# Patient Record
Sex: Male | Born: 1985 | Race: White | Hispanic: No | Marital: Married | Smoking: Former smoker
Health system: Southern US, Community
[De-identification: ages and names within clinical notes are randomized; demographics above are authoritative.]

## PROBLEM LIST (undated history)

## (undated) DIAGNOSIS — I509 Heart failure, unspecified: Secondary | ICD-10-CM

## (undated) DIAGNOSIS — I2510 Atherosclerotic heart disease of native coronary artery without angina pectoris: Secondary | ICD-10-CM

## (undated) DIAGNOSIS — I10 Essential (primary) hypertension: Secondary | ICD-10-CM

## (undated) DIAGNOSIS — F17200 Nicotine dependence, unspecified, uncomplicated: Secondary | ICD-10-CM

## (undated) HISTORY — DX: Atherosclerotic heart disease of native coronary artery without angina pectoris: I25.10

## (undated) HISTORY — DX: Heart failure, unspecified: I50.9

---

## 2004-11-08 ENCOUNTER — Emergency Department: Payer: Self-pay | Admitting: Emergency Medicine

## 2006-10-15 ENCOUNTER — Inpatient Hospital Stay (HOSPITAL_COMMUNITY): Admission: EM | Admit: 2006-10-15 | Discharge: 2006-10-21 | Payer: Self-pay | Admitting: Emergency Medicine

## 2007-01-25 ENCOUNTER — Ambulatory Visit (HOSPITAL_COMMUNITY): Admission: RE | Admit: 2007-01-25 | Discharge: 2007-01-25 | Payer: Self-pay | Admitting: Orthopedic Surgery

## 2007-02-09 ENCOUNTER — Emergency Department: Payer: Self-pay | Admitting: Emergency Medicine

## 2008-01-11 ENCOUNTER — Emergency Department: Payer: Self-pay | Admitting: Emergency Medicine
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
## 2009-01-19 ENCOUNTER — Emergency Department: Payer: Self-pay | Admitting: Emergency Medicine

## 2009-02-18 ENCOUNTER — Emergency Department: Payer: Self-pay | Admitting: Emergency Medicine

## 2009-03-19 ENCOUNTER — Emergency Department: Payer: Self-pay | Admitting: Internal Medicine

## 2009-07-13 ENCOUNTER — Emergency Department: Payer: Self-pay | Admitting: Emergency Medicine
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
## 2010-11-12 LAB — BASIC METABOLIC PANEL
BUN: 9
Calcium: 9.7
Creatinine, Ser: 0.93
GFR calc non Af Amer: >60

## 2010-11-12 LAB — CBC
MCV: 86.8
Platelets: 204
WBC: 9.6

## 2010-11-19 LAB — I-STAT 8, (EC8 V) (CONVERTED LAB)
Acid-base deficit: 6 — ABNORMAL HIGH
Bicarbonate: 21.5
Glucose, Bld: 122 — ABNORMAL HIGH
Hemoglobin: 15.3
Potassium: 3.5
Sodium: 138
TCO2: 23

## 2010-11-19 LAB — CBC
HCT: 35 — ABNORMAL LOW
HCT: 35.9 — ABNORMAL LOW
Hemoglobin: 13.9
MCHC: 34.8
MCV: 89.4
MCV: 90.3
Platelets: 142 — ABNORMAL LOW
Platelets: 150
Platelets: 205
RBC: 4.02 — ABNORMAL LOW
RBC: 4.35
RDW: 13.7
RDW: 14.1 — ABNORMAL HIGH
WBC: 12.8 — ABNORMAL HIGH
WBC: 7.1
WBC: 9.9

## 2010-11-19 LAB — PROTIME-INR: INR: 1.1

## 2010-11-19 LAB — BASIC METABOLIC PANEL
BUN: 11
Calcium: 9.8
Creatinine, Ser: 0.96
GFR calc Af Amer: >60
GFR calc non Af Amer: >60

## 2010-11-19 LAB — ETHANOL: Alcohol, Ethyl (B): 131 — ABNORMAL HIGH

## 2014-01-19 ENCOUNTER — Emergency Department: Payer: Self-pay | Admitting: Emergency Medicine

## 2015-09-18 DIAGNOSIS — N398 Other specified disorders of urinary system: Secondary | ICD-10-CM | POA: Insufficient documentation

## 2015-09-18 DIAGNOSIS — Z87442 Personal history of urinary calculi: Secondary | ICD-10-CM | POA: Insufficient documentation

## 2020-10-23 ENCOUNTER — Inpatient Hospital Stay: Payer: Medicaid Other

## 2020-10-23 ENCOUNTER — Encounter: Payer: Self-pay | Admitting: Cardiology

## 2020-10-23 ENCOUNTER — Inpatient Hospital Stay
Admission: EM | Admit: 2020-10-23 | Discharge: 2020-10-27 | DRG: 246 | Disposition: A | Discharge status: Home / Self Care | Payer: Medicaid Other | Attending: Internal Medicine | Admitting: Internal Medicine

## 2020-10-23 ENCOUNTER — Encounter
Admission: EM | Disposition: A | Discharge status: Home / Self Care | Payer: Self-pay | Source: Home / Self Care | Attending: Internal Medicine

## 2020-10-23 ENCOUNTER — Emergency Department: Payer: Medicaid Other

## 2020-10-23 DIAGNOSIS — R52 Pain, unspecified: Secondary | ICD-10-CM

## 2020-10-23 DIAGNOSIS — I998 Other disorder of circulatory system: Secondary | ICD-10-CM | POA: Diagnosis not present

## 2020-10-23 DIAGNOSIS — Z20822 Contact with and (suspected) exposure to covid-19: Secondary | ICD-10-CM | POA: Diagnosis present

## 2020-10-23 DIAGNOSIS — J690 Pneumonitis due to inhalation of food and vomit: Secondary | ICD-10-CM | POA: Diagnosis not present

## 2020-10-23 DIAGNOSIS — I2119 ST elevation (STEMI) myocardial infarction involving other coronary artery of inferior wall: Principal | ICD-10-CM | POA: Diagnosis present

## 2020-10-23 DIAGNOSIS — M79606 Pain in leg, unspecified: Secondary | ICD-10-CM | POA: Diagnosis not present

## 2020-10-23 DIAGNOSIS — Z0189 Encounter for other specified special examinations: Secondary | ICD-10-CM

## 2020-10-23 DIAGNOSIS — Z8241 Family history of sudden cardiac death: Secondary | ICD-10-CM

## 2020-10-23 DIAGNOSIS — Z79899 Other long term (current) drug therapy: Secondary | ICD-10-CM | POA: Diagnosis not present

## 2020-10-23 DIAGNOSIS — I2510 Atherosclerotic heart disease of native coronary artery without angina pectoris: Secondary | ICD-10-CM | POA: Diagnosis present

## 2020-10-23 DIAGNOSIS — J9692 Respiratory failure, unspecified with hypercapnia: Secondary | ICD-10-CM | POA: Diagnosis present

## 2020-10-23 DIAGNOSIS — Z4659 Encounter for fitting and adjustment of other gastrointestinal appliance and device: Secondary | ICD-10-CM

## 2020-10-23 DIAGNOSIS — R079 Chest pain, unspecified: Secondary | ICD-10-CM | POA: Diagnosis present

## 2020-10-23 DIAGNOSIS — F17210 Nicotine dependence, cigarettes, uncomplicated: Secondary | ICD-10-CM | POA: Diagnosis present

## 2020-10-23 DIAGNOSIS — R200 Anesthesia of skin: Secondary | ICD-10-CM

## 2020-10-23 DIAGNOSIS — I469 Cardiac arrest, cause unspecified: Secondary | ICD-10-CM

## 2020-10-23 DIAGNOSIS — I255 Ischemic cardiomyopathy: Secondary | ICD-10-CM | POA: Diagnosis present

## 2020-10-23 DIAGNOSIS — Z9911 Dependence on respirator [ventilator] status: Secondary | ICD-10-CM

## 2020-10-23 DIAGNOSIS — R7989 Other specified abnormal findings of blood chemistry: Secondary | ICD-10-CM

## 2020-10-23 DIAGNOSIS — D649 Anemia, unspecified: Secondary | ICD-10-CM | POA: Diagnosis present

## 2020-10-23 DIAGNOSIS — J9601 Acute respiratory failure with hypoxia: Secondary | ICD-10-CM | POA: Diagnosis present

## 2020-10-23 DIAGNOSIS — I4901 Ventricular fibrillation: Secondary | ICD-10-CM | POA: Diagnosis present

## 2020-10-23 DIAGNOSIS — J9602 Acute respiratory failure with hypercapnia: Secondary | ICD-10-CM | POA: Diagnosis present

## 2020-10-23 DIAGNOSIS — I472 Ventricular tachycardia: Secondary | ICD-10-CM | POA: Diagnosis present

## 2020-10-23 DIAGNOSIS — R7401 Elevation of levels of liver transaminase levels: Secondary | ICD-10-CM

## 2020-10-23 DIAGNOSIS — Z978 Presence of other specified devices: Secondary | ICD-10-CM

## 2020-10-23 DIAGNOSIS — I213 ST elevation (STEMI) myocardial infarction of unspecified site: Secondary | ICD-10-CM | POA: Diagnosis not present

## 2020-10-23 DIAGNOSIS — M79676 Pain in unspecified toe(s): Secondary | ICD-10-CM

## 2020-10-23 DIAGNOSIS — R579 Shock, unspecified: Secondary | ICD-10-CM | POA: Diagnosis present

## 2020-10-23 DIAGNOSIS — I442 Atrioventricular block, complete: Secondary | ICD-10-CM | POA: Diagnosis present

## 2020-10-23 HISTORY — PX: CORONARY/GRAFT ACUTE MI REVASCULARIZATION: CATH118305

## 2020-10-23 HISTORY — DX: Nicotine dependence, unspecified, uncomplicated: F17.200

## 2020-10-23 HISTORY — PX: LEFT HEART CATH AND CORONARY ANGIOGRAPHY: CATH118249

## 2020-10-23 HISTORY — DX: Essential (primary) hypertension: I10

## 2020-10-23 LAB — GLUCOSE, CAPILLARY
Glucose-Capillary: 215 mg/dL — ABNORMAL HIGH (ref 70–99)
Glucose-Capillary: 145 mg/dL — ABNORMAL HIGH (ref 70–99)

## 2020-10-23 LAB — URINE DRUG SCREEN, QUALITATIVE (ARMC ONLY)
Amphetamines, Ur Screen: NOT DETECTED
Barbiturates, Ur Screen: NOT DETECTED
Benzodiazepine, Ur Scrn: NOT DETECTED
Cannabinoid 50 Ng, Ur ~~LOC~~: POSITIVE — AB
Cocaine Metabolite,Ur ~~LOC~~: NOT DETECTED
MDMA (Ecstasy)Ur Screen: NOT DETECTED
Methadone Scn, Ur: NOT DETECTED
Opiate, Ur Screen: NOT DETECTED
Phencyclidine (PCP) Ur S: NOT DETECTED
Tricyclic, Ur Screen: NOT DETECTED

## 2020-10-23 LAB — URINALYSIS, ROUTINE W REFLEX MICROSCOPIC
Bilirubin Urine: NEGATIVE
Glucose, UA: NEGATIVE mg/dL
Ketones, ur: NEGATIVE mg/dL
Leukocytes,Ua: NEGATIVE
Nitrite: NEGATIVE
Protein, ur: NEGATIVE mg/dL
Specific Gravity, Urine: 1.02 (ref 1.005–1.030)
Squamous Epithelial / HPF: NONE SEEN (ref 0–5)
WBC, UA: NONE SEEN WBC/hpf (ref 0–5)
pH: 5 (ref 5.0–8.0)

## 2020-10-23 LAB — RESP PANEL BY RT-PCR (FLU A&B, COVID) ARPGX2
Influenza A by PCR: NEGATIVE
Influenza B by PCR: NEGATIVE
SARS Coronavirus 2 by RT PCR: NEGATIVE

## 2020-10-23 LAB — COMPREHENSIVE METABOLIC PANEL
ALT: 111 U/L — ABNORMAL HIGH (ref 0–44)
AST: 75 U/L — ABNORMAL HIGH (ref 15–41)
Albumin: 2 g/dL — ABNORMAL LOW (ref 3.5–5.0)
Alkaline Phosphatase: 24 U/L — ABNORMAL LOW (ref 38–126)
Anion gap: 11 (ref 5–15)
BUN: 9 mg/dL (ref 6–20)
CO2: 16 mmol/L — ABNORMAL LOW (ref 22–32)
Calcium: 5.1 mg/dL — CL (ref 8.9–10.3)
Chloride: 121 mmol/L — ABNORMAL HIGH (ref 98–111)
Creatinine, Ser: 0.65 mg/dL (ref 0.61–1.24)
GFR, Estimated: >60 mL/min (ref >60)
Glucose, Bld: 126 mg/dL — ABNORMAL HIGH (ref 70–99)
Potassium: <2 mmol/L — CL (ref 3.5–5.1)
Sodium: 148 mmol/L — ABNORMAL HIGH (ref 135–145)
Total Bilirubin: 0.4 mg/dL (ref 0.3–1.2)
Total Protein: 3.5 g/dL — ABNORMAL LOW (ref 6.5–8.1)

## 2020-10-23 LAB — TROPONIN I (HIGH SENSITIVITY): Troponin I (High Sensitivity): 8 ng/L (ref <18)

## 2020-10-23 LAB — CBC
HCT: 27.6 % — ABNORMAL LOW (ref 39.0–52.0)
Hemoglobin: 9.4 g/dL — ABNORMAL LOW (ref 13.0–17.0)
MCH: 32.4 pg (ref 26.0–34.0)
MCHC: 34.1 g/dL (ref 30.0–36.0)
MCV: 95.2 fL (ref 80.0–100.0)
Platelets: 78 10*3/uL — ABNORMAL LOW (ref 150–400)
RBC: 2.9 MIL/uL — ABNORMAL LOW (ref 4.22–5.81)
RDW: 13.2 % (ref 11.5–15.5)
WBC: 6.3 10*3/uL (ref 4.0–10.5)
nRBC: 0.3 % — ABNORMAL HIGH (ref 0.0–0.2)

## 2020-10-23 LAB — POTASSIUM: Potassium: 3.5 mmol/L (ref 3.5–5.1)

## 2020-10-23 LAB — MAGNESIUM: Magnesium: 2.4 mg/dL (ref 1.7–2.4)

## 2020-10-23 LAB — BLOOD GAS, ARTERIAL
Acid-base deficit: 7.6 mmol/L — ABNORMAL HIGH (ref 0.0–2.0)
Bicarbonate: 19.3 mmol/L — ABNORMAL LOW (ref 20.0–28.0)
FIO2: 60
MECHVT: 550 mL
O2 Saturation: 98.8 %
PEEP: 5 cmH2O
Patient temperature: 37
RATE: 16 resp/min
pCO2 arterial: 43 mmHg (ref 32.0–48.0)
pH, Arterial: 7.26 — ABNORMAL LOW (ref 7.350–7.450)
pO2, Arterial: 140 mmHg — ABNORMAL HIGH (ref 83.0–108.0)

## 2020-10-23 LAB — PROTIME-INR
INR: 1.5 — ABNORMAL HIGH (ref 0.8–1.2)
Prothrombin Time: 18.5 seconds — ABNORMAL HIGH (ref 11.4–15.2)

## 2020-10-23 LAB — PHOSPHORUS: Phosphorus: 4.4 mg/dL (ref 2.5–4.6)

## 2020-10-23 LAB — BRAIN NATRIURETIC PEPTIDE: B Natriuretic Peptide: 9.4 pg/mL (ref 0.0–100.0)

## 2020-10-23 LAB — MRSA NEXT GEN BY PCR, NASAL: MRSA by PCR Next Gen: NOT DETECTED

## 2020-10-23 LAB — POCT ACTIVATED CLOTTING TIME: Activated Clotting Time: 404 seconds

## 2020-10-23 SURGERY — CORONARY/GRAFT ACUTE MI REVASCULARIZATION
Anesthesia: Moderate Sedation

## 2020-10-23 MED ORDER — POTASSIUM CHLORIDE 10 MEQ/100ML IV SOLN
10.0000 meq | INTRAVENOUS | Status: DC
Start: 2020-10-23 — End: 2020-10-24
  Administered 2020-10-23: 10 meq via INTRAVENOUS
  Filled 2020-10-23 (×4): qty 100

## 2020-10-23 MED ORDER — NOREPINEPHRINE 4 MG/250ML-% IV SOLN
2.0000 ug/min | INTRAVENOUS | Status: DC
  Administered 2020-10-23: 5 ug/min via INTRAVENOUS
  Filled 2020-10-23: qty 250

## 2020-10-23 MED ORDER — LABETALOL HCL 5 MG/ML IV SOLN: 10.0000 mg | INTRAVENOUS | Status: DC | PRN

## 2020-10-23 MED ORDER — DOCUSATE SODIUM 50 MG/5ML PO LIQD: 100.0000 mg | Freq: Two times a day (BID) | ORAL | Status: DC | PRN

## 2020-10-23 MED ORDER — SODIUM CHLORIDE 0.9% FLUSH
3.0000 mL | Freq: Two times a day (BID) | INTRAVENOUS | Status: DC
  Administered 2020-10-24 – 2020-10-27 (×7): 3 mL via INTRAVENOUS

## 2020-10-23 MED ORDER — ACETAMINOPHEN 325 MG PO TABS: 650.0000 mg | ORAL_TABLET | ORAL | Status: DC | PRN

## 2020-10-23 MED ORDER — PROPOFOL 1000 MG/100ML IV EMUL
0.0000 ug/kg/min | INTRAVENOUS | Status: DC
  Administered 2020-10-23: 40 ug/kg/min via INTRAVENOUS
  Administered 2020-10-23 – 2020-10-24 (×3): 50 ug/kg/min via INTRAVENOUS
  Filled 2020-10-23 (×4): qty 100

## 2020-10-23 MED ORDER — ASPIRIN 81 MG PO CHEW
CHEWABLE_TABLET | ORAL | Status: DC | PRN
  Administered 2020-10-23: 324 mg via NASOGASTRIC

## 2020-10-23 MED ORDER — MIDAZOLAM HCL 2 MG/2ML IJ SOLN: 2.0000 mg | INTRAMUSCULAR | Status: DC | PRN

## 2020-10-23 MED ORDER — IOHEXOL 350 MG/ML SOLN
INTRAVENOUS | Status: DC | PRN
  Administered 2020-10-23: 150 mL

## 2020-10-23 MED ORDER — INSULIN ASPART 100 UNIT/ML IJ SOLN
0.0000 [IU] | INTRAMUSCULAR | Status: DC
  Administered 2020-10-23: 2 [IU] via SUBCUTANEOUS
  Administered 2020-10-23: 5 [IU] via SUBCUTANEOUS
  Filled 2020-10-23 (×2): qty 1

## 2020-10-23 MED ORDER — ATORVASTATIN CALCIUM 20 MG PO TABS
80.0000 mg | ORAL_TABLET | Freq: Every day | ORAL | Status: DC
  Filled 2020-10-23: qty 4

## 2020-10-23 MED ORDER — CHLORHEXIDINE GLUCONATE CLOTH 2 % EX PADS
6.0000 | MEDICATED_PAD | Freq: Every day | CUTANEOUS | Status: DC
  Administered 2020-10-23 – 2020-10-24 (×2): 6 via TOPICAL

## 2020-10-23 MED ORDER — POLYETHYLENE GLYCOL 3350 17 G PO PACK: 17.0000 g | PACK | Freq: Every day | ORAL | Status: DC

## 2020-10-23 MED ORDER — CANGRELOR TETRASODIUM 50 MG IV SOLR
INTRAVENOUS | Status: AC
  Filled 2020-10-23: qty 50

## 2020-10-23 MED ORDER — ASPIRIN 81 MG PO CHEW
CHEWABLE_TABLET | ORAL | Status: AC
  Filled 2020-10-23: qty 4

## 2020-10-23 MED ORDER — BIVALIRUDIN BOLUS VIA INFUSION - CUPID
INTRAVENOUS | Status: DC | PRN
  Administered 2020-10-23: 73.425 mg via INTRAVENOUS

## 2020-10-23 MED ORDER — SODIUM CHLORIDE 0.9 % WEIGHT BASED INFUSION
1.0000 mL/kg/h | INTRAVENOUS | Status: DC
  Administered 2020-10-23 – 2020-10-24 (×2): 1 mL/kg/h via INTRAVENOUS

## 2020-10-23 MED ORDER — NOREPINEPHRINE BITARTRATE 1 MG/ML IV SOLN
INTRAVENOUS | Status: AC | PRN
  Administered 2020-10-23: 10 ug/min via INTRAVENOUS

## 2020-10-23 MED ORDER — AMIODARONE HCL IN DEXTROSE 360-4.14 MG/200ML-% IV SOLN
INTRAVENOUS | Status: AC
  Administered 2020-10-23: 13 mg/h
  Filled 2020-10-23: qty 200

## 2020-10-23 MED ORDER — ENOXAPARIN SODIUM 40 MG/0.4ML IJ SOSY: 40.0000 mg | PREFILLED_SYRINGE | INTRAMUSCULAR | Status: DC

## 2020-10-23 MED ORDER — TICAGRELOR 90 MG PO TABS
ORAL_TABLET | ORAL | Status: AC
  Filled 2020-10-23: qty 2

## 2020-10-23 MED ORDER — LIDOCAINE HCL 1 % IJ SOLN
INTRAMUSCULAR | Status: AC
  Filled 2020-10-23: qty 20

## 2020-10-23 MED ORDER — ORAL CARE MOUTH RINSE
15.0000 mL | OROMUCOSAL | Status: DC
  Administered 2020-10-23 – 2020-10-24 (×5): 15 mL via OROMUCOSAL

## 2020-10-23 MED ORDER — NOREPINEPHRINE BITARTRATE 1 MG/ML IV SOLN
INTRAVENOUS | Status: AC | PRN
  Administered 2020-10-23: 10 ug/kg/min via INTRAVENOUS

## 2020-10-23 MED ORDER — CHLORHEXIDINE GLUCONATE 0.12% ORAL RINSE (MEDLINE KIT)
15.0000 mL | Freq: Two times a day (BID) | OROMUCOSAL | Status: DC
  Administered 2020-10-23 – 2020-10-25 (×3): 15 mL via OROMUCOSAL

## 2020-10-23 MED ORDER — ASPIRIN 81 MG PO CHEW
81.0000 mg | CHEWABLE_TABLET | Freq: Every day | ORAL | Status: DC
  Administered 2020-10-24 – 2020-10-27 (×4): 81 mg via ORAL
  Filled 2020-10-23 (×4): qty 1

## 2020-10-23 MED ORDER — DOCUSATE SODIUM 50 MG/5ML PO LIQD
100.0000 mg | Freq: Two times a day (BID) | ORAL | Status: DC
  Administered 2020-10-24: 100 mg
  Filled 2020-10-23: qty 10

## 2020-10-23 MED ORDER — PANTOPRAZOLE SODIUM 40 MG IV SOLR
40.0000 mg | INTRAVENOUS | Status: DC
  Administered 2020-10-23 – 2020-10-25 (×3): 40 mg via INTRAVENOUS
  Filled 2020-10-23 (×3): qty 40

## 2020-10-23 MED ORDER — BIVALIRUDIN TRIFLUOROACETATE 250 MG IV SOLR
INTRAVENOUS | Status: AC
  Filled 2020-10-23: qty 250

## 2020-10-23 MED ORDER — SODIUM CHLORIDE 0.9 % IV SOLN: 250.0000 mL | INTRAVENOUS | Status: DC | PRN

## 2020-10-23 MED ORDER — CALCIUM GLUCONATE-NACL 2-0.675 GM/100ML-% IV SOLN
2.0000 g | Freq: Once | INTRAVENOUS | Status: AC
  Administered 2020-10-23: 2000 mg via INTRAVENOUS
  Filled 2020-10-23: qty 100

## 2020-10-23 MED ORDER — MIDAZOLAM HCL 2 MG/2ML IJ SOLN
2.0000 mg | INTRAMUSCULAR | Status: DC | PRN
  Administered 2020-10-23 – 2020-10-24 (×2): 2 mg via INTRAVENOUS
  Filled 2020-10-23 (×2): qty 2

## 2020-10-23 MED ORDER — CANGRELOR BOLUS VIA INFUSION
INTRAVENOUS | Status: DC | PRN
  Administered 2020-10-23: 2937 ug via INTRAVENOUS

## 2020-10-23 MED ORDER — SODIUM CHLORIDE 0.9% FLUSH
3.0000 mL | INTRAVENOUS | Status: DC | PRN
  Administered 2020-10-26 – 2020-10-27 (×2): 3 mL via INTRAVENOUS

## 2020-10-23 MED ORDER — NOREPINEPHRINE BITARTRATE 1 MG/ML IV SOLN
INTRAVENOUS | Status: AC
  Filled 2020-10-23: qty 4

## 2020-10-23 MED ORDER — SODIUM CHLORIDE 0.9 % IV SOLN
250.0000 mL | INTRAVENOUS | Status: DC
  Administered 2020-10-23 – 2020-10-25 (×2): 250 mL via INTRAVENOUS

## 2020-10-23 MED ORDER — SODIUM CHLORIDE 0.9 % IV SOLN
INTRAVENOUS | Status: DC | PRN
  Administered 2020-10-23: 1.75 mg/kg/h via INTRAVENOUS

## 2020-10-23 MED ORDER — HYDRALAZINE HCL 20 MG/ML IJ SOLN: 10.0000 mg | INTRAMUSCULAR | Status: DC | PRN

## 2020-10-23 MED ORDER — LIDOCAINE HCL (PF) 1 % IJ SOLN
INTRAMUSCULAR | Status: DC | PRN
  Administered 2020-10-23: 30 mL

## 2020-10-23 MED ORDER — TICAGRELOR 90 MG PO TABS
90.0000 mg | ORAL_TABLET | Freq: Two times a day (BID) | ORAL | Status: DC
  Administered 2020-10-24 – 2020-10-27 (×7): 90 mg via ORAL
  Filled 2020-10-23 (×7): qty 1

## 2020-10-23 MED ORDER — POTASSIUM CHLORIDE 20 MEQ PO PACK
40.0000 meq | PACK | Freq: Once | ORAL | Status: AC
  Administered 2020-10-23: 40 meq
  Filled 2020-10-23: qty 2

## 2020-10-23 MED ORDER — SODIUM CHLORIDE 0.9 % IV SOLN
3.0000 g | Freq: Four times a day (QID) | INTRAVENOUS | Status: DC
  Administered 2020-10-23 – 2020-10-26 (×11): 3 g via INTRAVENOUS
  Filled 2020-10-23: qty 8
  Filled 2020-10-23 (×5): qty 3
  Filled 2020-10-23: qty 8
  Filled 2020-10-23 (×2): qty 3
  Filled 2020-10-23 (×3): qty 8
  Filled 2020-10-23: qty 3
  Filled 2020-10-23: qty 8

## 2020-10-23 MED ORDER — ONDANSETRON HCL 4 MG/2ML IJ SOLN
4.0000 mg | Freq: Four times a day (QID) | INTRAMUSCULAR | Status: DC | PRN
  Administered 2020-10-24: 4 mg via INTRAVENOUS
  Filled 2020-10-23: qty 2

## 2020-10-23 MED ORDER — FENTANYL CITRATE (PF) 100 MCG/2ML IJ SOLN: 50.0000 ug | INTRAMUSCULAR | Status: DC | PRN

## 2020-10-23 MED ORDER — TICAGRELOR 90 MG PO TABS
ORAL_TABLET | ORAL | Status: DC | PRN
  Administered 2020-10-23: 180 mg via NASOGASTRIC

## 2020-10-23 MED ORDER — AMIODARONE HCL 150 MG/3ML IV SOLN
INTRAVENOUS | Status: DC | PRN
  Administered 2020-10-23: 30 mg via INTRAVENOUS

## 2020-10-23 MED ORDER — SODIUM CHLORIDE 0.9 % IV SOLN
INTRAVENOUS | Status: AC | PRN
  Administered 2020-10-23: 4 ug/kg/min via INTRAVENOUS

## 2020-10-23 MED ORDER — ENOXAPARIN SODIUM 60 MG/0.6ML IJ SOSY
0.5000 mg/kg | PREFILLED_SYRINGE | INTRAMUSCULAR | Status: DC
  Administered 2020-10-23: 50 mg via SUBCUTANEOUS
  Filled 2020-10-23: qty 0.5

## 2020-10-23 MED ORDER — FENTANYL CITRATE (PF) 100 MCG/2ML IJ SOLN
50.0000 ug | INTRAMUSCULAR | Status: DC | PRN
  Administered 2020-10-23: 100 ug via INTRAVENOUS
  Filled 2020-10-23: qty 2

## 2020-10-23 MED ORDER — AMIODARONE HCL IN DEXTROSE 360-4.14 MG/200ML-% IV SOLN
30.0000 mg/h | INTRAVENOUS | Status: DC
  Administered 2020-10-23 – 2020-10-25 (×4): 30 mg/h via INTRAVENOUS
  Filled 2020-10-23 (×3): qty 200

## 2020-10-23 SURGICAL SUPPLY — 19 items
BALLN TREK RX 2.25X15 (BALLOONS) ×2
BALLOON TREK RX 2.25X15 (BALLOONS) ×1 IMPLANT
CATH INFINITI 5FR ANG PIGTAIL (CATHETERS) ×2 IMPLANT
CATH INFINITI 5FR JL4 (CATHETERS) ×2 IMPLANT
CATH INFINITI JR4 5F (CATHETERS) ×2 IMPLANT
CATH VISTA GUIDE 6FR JR4 (CATHETERS) ×2 IMPLANT
DEVICE CLOSURE MYNXGRIP 6/7F (Vascular Products) ×2 IMPLANT
DRAPE BRACHIAL (DRAPES) ×2 IMPLANT
KIT ENCORE 26 ADVANTAGE (KITS) ×2 IMPLANT
KIT SYRINGE INJ CVI SPIKEX1 (MISCELLANEOUS) ×2 IMPLANT
NEEDLE PERC 18GX7CM (NEEDLE) ×2 IMPLANT
PACK CARDIAC CATH (CUSTOM PROCEDURE TRAY) ×2 IMPLANT
PROTECTION STATION PRESSURIZED (MISCELLANEOUS) ×2
SET ATX SIMPLICITY (MISCELLANEOUS) ×2 IMPLANT
SHEATH AVANTI 6FR X 11CM (SHEATH) ×2 IMPLANT
STATION PROTECTION PRESSURIZED (MISCELLANEOUS) ×1 IMPLANT
STENT ONYX FRONTIER 3.0X18 (Permanent Stent) ×2 IMPLANT
WIRE ASAHI PROWATER 180CM (WIRE) ×2 IMPLANT
WIRE GUIDERIGHT .035X150 (WIRE) ×2 IMPLANT

## 2020-10-23 NOTE — Progress Notes (Signed)
Anticoagulation monitoring(Lovenox):  35 yo  male ordered Lovenox 40 mg Q24h    Filed Weights   10/23/20 1759  Weight: 97.9 kg (215 lb 13.3 oz)   BMI 33   Lab Results  Component Value Date   CREATININE 0.65 10/23/2020   CREATININE 0.93 01/24/2007   CREATININE 0.96 10/19/2006   Estimated Creatinine Clearance: 143.6 mL/min (by C-G formula based on SCr of 0.65 mg/dL). Hemoglobin & Hematocrit     Component Value Date/Time   HGB 9.4 (L) 10/23/2020 1733   HCT 27.6 (L) 10/23/2020 1733     Per Protocol for Patient with estCrcl > 30 ml/min and BMI > 30, will transition to Lovenox 50 mg Q24h.
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
## 2020-10-23 NOTE — ED Notes (Signed)
Pt lying in bed intubated with 8.0 tube 22 at lip. Pt lungs equal and expanding with oxygen saturation in high 90s. Pt family at bedside with cardiologist speaking about heart cath. Pt vitals stable at this time.
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
## 2020-10-23 NOTE — Code Documentation (Addendum)
1701 Pt arrived to room 26, no pulse palpated and compressions started @ 1700 when pulled from car 1701- 1 epinephrine 1702 -- shock Asystole 1703 CPR resumed  1705 Lucas placed 1705 1 epinephrine 1708 Pulse check-v fib-shock-compressions resumed 1709 Amiodorone 150 push 1710 Ketamine 80 1710 Rocuronium 80 1711 1 epinephrine 1712 pulse check-no pulse, fine v-fib, shock, asystole, resume compressions 1713, nor epi started at 25 mcg/min 1715 pulse check, v fib, defib, resume compressions 1716 1 amp bicarb 1716 1 epinephrine 1717 TNK 40 mg 1718 pulse check-? [place]? -synched cardioversion 1721 pulse check-ROSC 1723 2 gram magnesium 1725 150 amiodorone 1728 paused norepi d/t increased BP 1735 norepinephrine started back at 10 mcg/min d/t hypotension

## 2020-10-23 NOTE — Progress Notes (Signed)
Chaplain Maggie offered follow up support to family in ICU waiting room. Gratitude was expressed as the events of the day were considered reflectively. Continued support available per on call chaplain.

## 2020-10-23 NOTE — Consult Note (Signed)
[facility] Cardiology  CARDIOLOGY CONSULT NOTE  Patient ID: [name] MRN: [id number] DOB/AGE: 36-Mar-1987 36 y.o.  Admit date: 10/23/2020 Referring Physician [facility] Primary Physician  Primary Cardiologist  Reason for Consultation inferior STEMI  HPI: 35 year old gentleman referred for inferior STEMI.  Earlier today, the patient was brought by family members for evaluation of chest pain and shortness of breath.  As they pulled into [facility] ED, patient became unresponsive.  In the emergency room, the patient was pulseless with ventricular tachycardia, received CPR and multiple shocks.  The patient was intubated, placed on Levophed and amiodarone drip.  ECG revealed ST elevations in inferior and lateral leads with ST depression in leads I and aVL.  The patient was brought urgently to the cardiac catheterization laboratory where coronary angiography revealed occluded proximal RCA.  The patient underwent primary PCI receiving DES in proximal RCA with excellent angiographic result.  Left ventriculography revealed preserved left ventricular function, with LV ejection fraction 50 to 55% with mild inferior wall hypokinesis.  According to family members, the patient has no prior medical history, with exception of tobacco abuse.  Review of systems complete and found to be negative unless listed above      No medications prior to admission.   Social History   Socioeconomic History   Marital status: Single    Spouse name: Not on file   Number of children: Not on file   Years of education: Not on file   Highest education level: Not on file  Occupational History   Not on file  Tobacco Use   Smoking status: Not on file   Smokeless tobacco: Not on file  Substance and Sexual Activity   Alcohol use: Not on file   Drug use: Not on file   Sexual activity: Not on file  Other Topics Concern   Not on file  Social History Narrative   Not on file   Social Determinants of Health   Financial Resource  Strain: Not on file  Food Insecurity: Not on file  Transportation Needs: Not on file  Physical Activity: Not on file  Stress: Not on file  Social Connections: Not on file  Intimate Partner Violence: Not on file    No family history on file.    Review of systems complete and found to be negative unless listed above      PHYSICAL EXAM  General: Intubated HEENT:  Normocephalic and atramatic Neck:  No JVD.  Lungs: Clear bilaterally to auscultation and percussion. Heart: HRRR . Normal S1 and S2 without gallops or murmurs.  Abdomen: Bowel sounds are positive, abdomen soft and non-tender  Msk:  Back normal, normal gait. Normal strength and tone for age. Extremities: No clubbing, cyanosis or edema.   Neuro: Intubated Psych: Intubated  Labs:   Lab Results  Component Value Date   WBC 6.3 10/23/2020   HGB 9.4 (L) 10/23/2020   HCT 27.6 (L) 10/23/2020   MCV 95.2 10/23/2020   PLT 78 (L) 10/23/2020    Recent Labs  Lab 10/23/20 1734  NA 148*  K <2.0*  CL 121*  CO2 16*  BUN 9  CREATININE 0.65  CALCIUM 5.1*  PROT 3.5*  BILITOT 0.4  ALKPHOS 24*  ALT 111*  AST 75*  GLUCOSE 126*   No results found for: CKTOTAL, CKMB, CKMBINDEX, TROPONINI No results found for: CHOL No results found for: HDL No results found for: LDLCALC No results found for: TRIG No results found for: CHOLHDL No results found for: LDLDIRECT  Radiology: CARDIAC CATHETERIZATION  Result Date: 10/23/2020   Prox RCA-1 lesion is 100% stenosed.   Mid Cx to Dist Cx lesion is 30% stenosed.   Prox RCA-2 lesion is 50% stenosed.   RPDA lesion is 100% stenosed.   A drug-eluting stent was successfully placed using a STENT ONYX FRONTIER 3.0X18.   Post intervention, there is a 0% residual stenosis.   LV end diastolic pressure is normal.   The left ventricular ejection fraction is 50-55% by visual estimate. 1.  Inferior ST elevation myocardial infarction 2.  Successful primary PCI with DES ostial/proximal RCA 3.   Preserved left ventricular function, with estimated LV ejection fraction 30% Recommendations 1.  Dual antiplatelet therapy uninterrupted for 1 year 2.  High intensity atorvastatin 3.  2D echocardiogram   DG Chest Port 1 View  Result Date: 10/23/2020 CLINICAL DATA:  CPR; post CPR after found unresponsive EXAM: PORTABLE CHEST 1 VIEW COMPARISON:  2015 FINDINGS: Endotracheal tube in satisfactory position. Enteric tube tip is at the gastroesophageal junction. Lung volumes are low. No consolidation. Probable patchy atelectasis. No pleural effusion. No pneumothorax. IMPRESSION: Endotracheal tube in place. Enteric tube tip at gastroesophageal junction. Low lung volumes with probable patchy atelectasis. Electronically Signed   By: Guadlupe Spanish M.D.   On: 10/23/2020 18:36    EKG: Sinus rhythm with ST elevation in leads II, III and aVF V4 through V6  ASSESSMENT AND PLAN:   1.  Inferior ST elevation myocardial infarction, with occluded proximal RCA, status post successful primary PCI with DES proximal RCA, with preserved left ventricular function 2.  Status post cardiac arrest, intubated, on amiodarone drip  Recommendations  1.  Dual antiplatelet therapy uninterrupted for 1 year 2.  High intensity atorvastatin 80 mg daily 3.  2D echocardiogram 4.  Further recommendations including beta-blocker and ACE inhibitor when patient extubated and stabilized  Signed: Marcina Millard MD,PhD, [facility] 10/23/2020, 8:06 PM

## 2020-10-23 NOTE — Progress Notes (Signed)
Pharmacy Antibiotic Note  [name] is a 35 y.o. male admitted on 10/23/2020 with  STEMI .  Pharmacy has been consulted for Unasyn dosing for aspiration pneumonia  Plan: Unasyn 3g IV q6h  Height: 5\' 7"  (170.2 cm) Weight: 97.9 kg (215 lb 13.3 oz) IBW/kg (Calculated) : 66.1  Temp (24hrs), Avg:97.3 F (36.3 C), Min:95.5 F (35.3 C), Max:98.2 F (36.8 C)  Recent Labs  Lab 10/23/20 1733 10/23/20 1734  WBC 6.3  --   CREATININE  --  0.65    Estimated Creatinine Clearance: 143.6 mL/min (by C-G formula based on SCr of 0.65 mg/dL).    No Known Allergies  Antimicrobials this admission: Unasyn 9/16 >>     Dose adjustments this admission:  Microbiology results:  Thank you for allowing pharmacy to be a part of this patient's care.  10/16, PharmD, BCPS 10/23/2020 9:41 PM

## 2020-10-23 NOTE — Progress Notes (Signed)
Chaplain Maggie responded to ED request to accompany pt's wife while cpr was in process. Space was made for being present to the family until pt was stabilized and eventually moved to cath lab. Hospitality, prayer and compassionate presence was offered. Chaplain hosted family and guided them to the cath lab waiting area. Continued support available per on call chaplain.

## 2020-10-23 NOTE — Progress Notes (Signed)
eLink Physician-Brief Progress Note Patient Name: [name] DOB: 1985/11/10 MRN: [id number]   Date of Service  10/23/2020  HPI/Events of Note  STEMI; cardiac arrest; s/p cath/pci; remains on vent  eICU Interventions  Abg pending; pt stable in ICU     Intervention Category Evaluation Type: New Patient Evaluation  Jacinta Shoe 10/23/2020, 9:50 PM

## 2020-10-23 NOTE — ED Triage Notes (Signed)
Pt presents via POV for unresponsive episode. Pt arrived unresponsive when ED nurses pulled pt from vehicle, pt had no pulse. CPR initiated and pt brought back to room 26.

## 2020-10-24 ENCOUNTER — Inpatient Hospital Stay: Payer: Medicaid Other

## 2020-10-24 DIAGNOSIS — I469 Cardiac arrest, cause unspecified: Secondary | ICD-10-CM

## 2020-10-24 DIAGNOSIS — J9601 Acute respiratory failure with hypoxia: Secondary | ICD-10-CM

## 2020-10-24 DIAGNOSIS — R579 Shock, unspecified: Secondary | ICD-10-CM

## 2020-10-24 LAB — COMPREHENSIVE METABOLIC PANEL
ALT: 337 U/L — ABNORMAL HIGH (ref 0–44)
ALT: 345 U/L — ABNORMAL HIGH (ref 0–44)
AST: 395 U/L — ABNORMAL HIGH (ref 15–41)
AST: 346 U/L — ABNORMAL HIGH (ref 15–41)
Albumin: 3.5 g/dL (ref 3.5–5.0)
Albumin: 3.7 g/dL (ref 3.5–5.0)
Alkaline Phosphatase: 45 U/L (ref 38–126)
Alkaline Phosphatase: 45 U/L (ref 38–126)
Anion gap: 7 (ref 5–15)
Anion gap: 9 (ref 5–15)
BUN: 19 mg/dL (ref 6–20)
BUN: 18 mg/dL (ref 6–20)
CO2: 21 mmol/L — ABNORMAL LOW (ref 22–32)
CO2: 21 mmol/L — ABNORMAL LOW (ref 22–32)
Calcium: 8.2 mg/dL — ABNORMAL LOW (ref 8.9–10.3)
Calcium: 8 mg/dL — ABNORMAL LOW (ref 8.9–10.3)
Chloride: 109 mmol/L (ref 98–111)
Chloride: 107 mmol/L (ref 98–111)
Creatinine, Ser: 1.07 mg/dL (ref 0.61–1.24)
Creatinine, Ser: 1.06 mg/dL (ref 0.61–1.24)
GFR, Estimated: >60 mL/min (ref >60)
GFR, Estimated: >60 mL/min (ref >60)
Glucose, Bld: 105 mg/dL — ABNORMAL HIGH (ref 70–99)
Glucose, Bld: 104 mg/dL — ABNORMAL HIGH (ref 70–99)
Potassium: 5.4 mmol/L — ABNORMAL HIGH (ref 3.5–5.1)
Potassium: 4.9 mmol/L (ref 3.5–5.1)
Sodium: 137 mmol/L (ref 135–145)
Sodium: 137 mmol/L (ref 135–145)
Total Bilirubin: 0.7 mg/dL (ref 0.3–1.2)
Total Bilirubin: 0.6 mg/dL (ref 0.3–1.2)
Total Protein: 6.3 g/dL — ABNORMAL LOW (ref 6.5–8.1)
Total Protein: 6.3 g/dL — ABNORMAL LOW (ref 6.5–8.1)

## 2020-10-24 LAB — CBC
HCT: 41.7 % (ref 39.0–52.0)
Hemoglobin: 15.1 g/dL (ref 13.0–17.0)
MCH: 32.3 pg (ref 26.0–34.0)
MCHC: 36.2 g/dL — ABNORMAL HIGH (ref 30.0–36.0)
MCV: 89.3 fL (ref 80.0–100.0)
Platelets: 187 10*3/uL (ref 150–400)
RBC: 4.67 MIL/uL (ref 4.22–5.81)
RDW: 13.5 % (ref 11.5–15.5)
WBC: 18.6 10*3/uL — ABNORMAL HIGH (ref 4.0–10.5)
nRBC: 0 % (ref 0.0–0.2)

## 2020-10-24 LAB — TROPONIN I (HIGH SENSITIVITY): Troponin I (High Sensitivity): >24000 ng/L (ref <18)

## 2020-10-24 LAB — LIPID PANEL
Cholesterol: 176 mg/dL (ref 0–200)
HDL: 27 mg/dL — ABNORMAL LOW (ref >40)
LDL Cholesterol: 118 mg/dL — ABNORMAL HIGH (ref 0–99)
Total CHOL/HDL Ratio: 6.5 RATIO
Triglycerides: 156 mg/dL — ABNORMAL HIGH (ref <150)
VLDL: 31 mg/dL (ref 0–40)

## 2020-10-24 LAB — BLOOD GAS, ARTERIAL
Acid-base deficit: 2.9 mmol/L — ABNORMAL HIGH (ref 0.0–2.0)
Bicarbonate: 22 mmol/L (ref 20.0–28.0)
FIO2: 50
MECHVT: 520 mL
O2 Saturation: 99.5 %
PEEP: 5 cmH2O
Patient temperature: 37
RATE: 24 resp/min
pCO2 arterial: 38 mmHg (ref 32.0–48.0)
pH, Arterial: 7.37 (ref 7.350–7.450)
pO2, Arterial: 170 mmHg — ABNORMAL HIGH (ref 83.0–108.0)

## 2020-10-24 LAB — TRIGLYCERIDES: Triglycerides: 148 mg/dL (ref <150)

## 2020-10-24 LAB — GLUCOSE, CAPILLARY
Glucose-Capillary: 105 mg/dL — ABNORMAL HIGH (ref 70–99)
Glucose-Capillary: 98 mg/dL (ref 70–99)
Glucose-Capillary: 119 mg/dL — ABNORMAL HIGH (ref 70–99)
Glucose-Capillary: 110 mg/dL — ABNORMAL HIGH (ref 70–99)

## 2020-10-24 LAB — HEMOGLOBIN A1C
Hgb A1c MFr Bld: 5.4 % (ref 4.8–5.6)
Hgb A1c MFr Bld: 5 % (ref 4.8–5.6)
Mean Plasma Glucose: 108.28 mg/dL
Mean Plasma Glucose: 96.8 mg/dL

## 2020-10-24 LAB — TYPE AND SCREEN
ABO/RH(D): A NEG
Antibody Screen: NEGATIVE

## 2020-10-24 LAB — PHOSPHORUS: Phosphorus: 2.2 mg/dL — ABNORMAL LOW (ref 2.5–4.6)

## 2020-10-24 LAB — PROCALCITONIN: Procalcitonin: 1.94 ng/mL

## 2020-10-24 LAB — MAGNESIUM: Magnesium: 2.2 mg/dL (ref 1.7–2.4)

## 2020-10-24 LAB — HIV ANTIBODY (ROUTINE TESTING W REFLEX): HIV Screen 4th Generation wRfx: NONREACTIVE

## 2020-10-24 MED ORDER — METOPROLOL TARTRATE 25 MG PO TABS
25.0000 mg | ORAL_TABLET | Freq: Two times a day (BID) | ORAL | Status: DC
  Administered 2020-10-24 – 2020-10-27 (×6): 25 mg via ORAL
  Filled 2020-10-24 (×6): qty 1

## 2020-10-24 MED ORDER — LISINOPRIL 5 MG PO TABS
2.5000 mg | ORAL_TABLET | Freq: Every day | ORAL | Status: DC
  Administered 2020-10-25 – 2020-10-27 (×3): 2.5 mg via ORAL
  Filled 2020-10-24 (×3): qty 1

## 2020-10-24 MED ORDER — POTASSIUM & SODIUM PHOSPHATES 280-160-250 MG PO PACK: 2.0000 | PACK | Freq: Three times a day (TID) | ORAL | Status: DC

## 2020-10-24 MED ORDER — IBUPROFEN 400 MG PO TABS
800.0000 mg | ORAL_TABLET | Freq: Three times a day (TID) | ORAL | Status: DC | PRN
  Administered 2020-10-24: 800 mg
  Filled 2020-10-24: qty 2

## 2020-10-24 MED ORDER — IBUPROFEN 400 MG PO TABS
800.0000 mg | ORAL_TABLET | Freq: Three times a day (TID) | ORAL | Status: DC | PRN
  Administered 2020-10-25 – 2020-10-26 (×2): 800 mg via ORAL
  Filled 2020-10-24 (×2): qty 2

## 2020-10-24 MED ORDER — METOPROLOL TARTRATE 25 MG PO TABS
25.0000 mg | ORAL_TABLET | Freq: Two times a day (BID) | ORAL | Status: DC
  Administered 2020-10-24: 25 mg
  Filled 2020-10-24: qty 1

## 2020-10-24 MED ORDER — CHLORHEXIDINE GLUCONATE CLOTH 2 % EX PADS
6.0000 | MEDICATED_PAD | Freq: Every day | CUTANEOUS | Status: DC
  Administered 2020-10-25: 6 via TOPICAL

## 2020-10-24 MED ORDER — LISINOPRIL 5 MG PO TABS
2.5000 mg | ORAL_TABLET | Freq: Every day | ORAL | Status: DC
  Administered 2020-10-24: 2.5 mg
  Filled 2020-10-24: qty 1

## 2020-10-24 MED ORDER — CHLORHEXIDINE GLUCONATE 0.12 % MT SOLN
15.0000 mL | Freq: Two times a day (BID) | OROMUCOSAL | Status: DC
  Administered 2020-10-24 – 2020-10-25 (×2): 15 mL via OROMUCOSAL
  Filled 2020-10-24 (×2): qty 15

## 2020-10-24 MED ORDER — POLYVINYL ALCOHOL 1.4 % OP SOLN
1.0000 [drp] | OPHTHALMIC | Status: DC | PRN
  Administered 2020-10-24 – 2020-10-25 (×2): 1 [drp] via OPHTHALMIC
  Filled 2020-10-24: qty 15

## 2020-10-24 MED ORDER — POTASSIUM & SODIUM PHOSPHATES 280-160-250 MG PO PACK
2.0000 | PACK | Freq: Three times a day (TID) | ORAL | Status: AC
  Administered 2020-10-24 (×2): 2
  Filled 2020-10-24 (×2): qty 2

## 2020-10-24 MED ORDER — ENOXAPARIN SODIUM 40 MG/0.4ML IJ SOSY
40.0000 mg | PREFILLED_SYRINGE | INTRAMUSCULAR | Status: DC
  Administered 2020-10-24 – 2020-10-26 (×3): 40 mg via SUBCUTANEOUS
  Filled 2020-10-24 (×3): qty 0.4

## 2020-10-24 MED ORDER — DOCUSATE SODIUM 50 MG/5ML PO LIQD
100.0000 mg | Freq: Two times a day (BID) | ORAL | Status: DC | PRN
  Administered 2020-10-26: 100 mg via ORAL
  Filled 2020-10-24 (×2): qty 10

## 2020-10-24 MED ORDER — MENTHOL 3 MG MT LOZG
1.0000 | LOZENGE | OROMUCOSAL | Status: DC | PRN
  Filled 2020-10-24: qty 9

## 2020-10-24 MED ORDER — ORAL CARE MOUTH RINSE
15.0000 mL | Freq: Two times a day (BID) | OROMUCOSAL | Status: DC
  Administered 2020-10-26 – 2020-10-27 (×3): 15 mL via OROMUCOSAL

## 2020-10-24 NOTE — Consult Note (Signed)
PHARMACY CONSULT NOTE - FOLLOW UP  Pharmacy Consult for Electrolyte Monitoring and Replacement   Recent Labs: Potassium (mmol/L)  Date Value  10/24/2020 4.9   Magnesium (mg/dL)  Date Value  90/24/0973 2.2   Calcium (mg/dL)  Date Value  53/29/9242 8.0 (L)   Albumin (g/dL)  Date Value  68/34/1962 3.7   Phosphorus (mg/dL)  Date Value  22/97/9892 2.2 (L)   Sodium (mmol/L)  Date Value  10/24/2020 137     Assessment: 35 year old male presenting to [facility] ED on 10/23/2020 unresponsive in personal vehicle. S/p PCI with DES promximal RCA. On amio gtt.   Goal of Therapy:  WNL  Plan:  Will give [place] packets x 2.  F/u with AM labs.   Ronnald Ramp ,PharmD Clinical Pharmacist 10/24/2020 8:51 AM

## 2020-10-24 NOTE — Progress Notes (Signed)
Chaplain Maggie made follow up visit with pt and family at bedside. Ministry of presence and calm offered. Family doing well and eager to support pt as he wakes up. Continued support available per on call chaplain.

## 2020-10-24 NOTE — Progress Notes (Signed)
   10/24/20 1820  Clinical Encounter Type  Visited With Patient and family together  Visit Type Follow-up  Referral From Other (Comment) (rounding)  [place] (Comment) (celebration; fatigue)  Chaplain Burris followed-up with Pt and family. Chaplain met wife, [name] and checked in with Pt and mom, [name]. Spoke more with mother about how events can help [place] focus on what is most important and bring family together. Continued celebration for [name]'s survival. Family and Pt doing well tonight.

## 2020-10-24 NOTE — Progress Notes (Signed)
Pt complained of decreased knee sensation after extubation. This has now spread to ankle. Pt did have hematoma marked at groin site. Groin site is bruised but soft. Pt appears and moves symmetrically in all extremities. Pedal and radial pulses palpable. Symmetrical smile. MD made aware and new orders received.

## 2020-10-24 NOTE — Progress Notes (Signed)
10/24/20 1110  Clinical Encounter Type  Visited With Patient and family together  Visit Type Initial  Referral From Chaplain  Consult/Referral To [place] followed-up on referral from [place]. Met with Pt and mother at bedside. Chaplain Burris provided reflective listening and compassionate presence. Pt was talkative and reports feeling like he could "get right out of bed and walk around" and was checking on kids' birthday party! Mother and Pt reflective today and processing meaning and emotions post-event. Mother extremely appreciative of support and interventions from [place]. Chaplain Burris encouraged continued calm, centering, and slower pace with lots of self-care. Offered prayer at Pt's request.

## 2020-10-24 NOTE — Progress Notes (Signed)
[facility] Cardiology  SUBJECTIVE: Laying in bed surrounded by family members, denies chest pain   Vitals:   10/24/20 0700 10/24/20 0724 10/24/20 0800 10/24/20 0851  BP: 110/76  112/71   Pulse: (!) 101  (!) 105   Resp: 20  (!) 30   Temp: 99.7 F (37.6 C)  99.7 F (37.6 C)   TempSrc:   Bladder   SpO2: 97% 96% 96% 95%  Weight:      Height:         Intake/Output Summary (Last 24 hours) at 10/24/2020 1002 Last data filed at 10/24/2020 0736 Gross per 24 hour  Intake 1526.77 ml  Output 1225 ml  Net 301.77 ml      PHYSICAL EXAM  General: Well developed, well nourished, in no acute distress HEENT:  Normocephalic and atramatic Neck:  No JVD.  Lungs: Clear bilaterally to auscultation and percussion. Heart: HRRR . Normal S1 and S2 without gallops or murmurs.  Abdomen: Bowel sounds are positive, abdomen soft and non-tender  Msk:  Back normal, normal gait. Normal strength and tone for age. Extremities: No clubbing, cyanosis or edema.   Neuro: Alert and oriented X 3. Psych:  Good affect, responds appropriately   LABS: Basic Metabolic Panel: Recent Labs    10/23/20 2158 10/24/20 0450 10/24/20 0730  NA  --  137 137  K 3.5 5.4* 4.9  CL  --  109 107  CO2  --  21* 21*  GLUCOSE  --  105* 104*  BUN  --  19 18  CREATININE  --  1.07 1.06  CALCIUM  --  8.2* 8.0*  MG 2.4 2.2  --   PHOS 4.4 2.2*  --    Liver Function Tests: Recent Labs    10/24/20 0450 10/24/20 0730  AST 395* 346*  ALT 337* 345*  ALKPHOS 45 45  BILITOT 0.7 0.6  PROT 6.3* 6.3*  ALBUMIN 3.5 3.7   No results for input(s): LIPASE, AMYLASE in the last 72 hours. CBC: Recent Labs    10/23/20 1733 10/24/20 0450  WBC 6.3 18.6*  HGB 9.4* 15.1  HCT 27.6* 41.7  MCV 95.2 89.3  PLT 78* 187   Cardiac Enzymes: No results for input(s): CKTOTAL, CKMB, CKMBINDEX, TROPONINI in the last 72 hours. BNP: Invalid input(s): POCBNP D-Dimer: No results for input(s): DDIMER in the last 72 hours. Hemoglobin A1C: Recent  Labs    10/23/20 2158  HGBA1C 5.4   Fasting Lipid Panel: Recent Labs    10/24/20 0450  CHOL 176  HDL 27*  LDLCALC 118*  TRIG 148  156*  CHOLHDL 6.5   Thyroid Function Tests: No results for input(s): TSH, T4TOTAL, T3FREE, THYROIDAB in the last 72 hours.  Invalid input(s): FREET3 Anemia Panel: No results for input(s): VITAMINB12, FOLATE, FERRITIN, TIBC, IRON, RETICCTPCT in the last 72 hours.  DG Abd 1 View  Result Date: 10/23/2020 CLINICAL DATA:  Orogastric tube placement. EXAM: ABDOMEN - 1 VIEW COMPARISON:  None. FINDINGS: Enteric tube noted with tip and side port overlying the expected region of the gastric lumen. The bowel gas pattern is normal. Excreted intravenous contrast noted within the kidneys. No radio-opaque calculi or other significant radiographic abnormality are seen. IMPRESSION: Enteric tube in grossly appropriate position. Electronically Signed   By: Tish Frederickson M.D.   On: 10/23/2020 23:02   DG Abd 1 View  Result Date: 10/23/2020 CLINICAL DATA:  OG tube placement EXAM: ABDOMEN - 1 VIEW COMPARISON:  10/23/2020 FINDINGS: Esophageal tube tip overlies the GE  junction region, side-port at the level of distal esophagus. Contrast within the renal collecting systems. IMPRESSION: 1. Esophageal tube tip at GE junction region, side-port in the region of distal esophagus, consider advancement by 10-15 cm for more optimal positioning. These results will be called to the ordering clinician or representative by the Radiologist Assistant, and communication documented in the PACS or [employer]. Electronically Signed   By: Jasmine Pang M.D.   On: 10/23/2020 21:39   CARDIAC CATHETERIZATION  Result Date: 10/23/2020   Prox RCA-1 lesion is 100% stenosed.   Mid Cx to Dist Cx lesion is 30% stenosed.   Prox RCA-2 lesion is 50% stenosed.   RPDA lesion is 100% stenosed.   A drug-eluting stent was successfully placed using a STENT ONYX FRONTIER 3.0X18.   Post intervention, there is a 0%  residual stenosis.   LV end diastolic pressure is normal.   The left ventricular ejection fraction is 50-55% by visual estimate. 1.  Inferior ST elevation myocardial infarction 2.  Successful primary PCI with DES ostial/proximal RCA 3.  Preserved left ventricular function, with estimated LV ejection fraction 30% Recommendations 1.  Dual antiplatelet therapy uninterrupted for 1 year 2.  High intensity atorvastatin 3.  2D echocardiogram   DG Chest Port 1 View  Result Date: 10/24/2020 CLINICAL DATA:  Endotracheal tube EXAM: PORTABLE CHEST 1 VIEW COMPARISON:  Chest radiograph 1 day prior FINDINGS: The endotracheal tube is in the lower thoracic trachea approximately 1.1 cm from the carina. The enteric catheter courses off the field of view. Multiple additional leads and defibrillator pad overlie the chest. The cardiomediastinal silhouette is stable. Lung aeration is not significantly changed, with no new focal airspace opacity, pleural effusion, or pneumothorax. There is no acute osseous abnormality. IMPRESSION: 1. Endotracheal tube approximately 1.1 cm from the carina. 2. Unchanged lung aeration. Electronically Signed   By: Lesia Hausen M.D.   On: 10/24/2020 08:03   DG Chest Port 1 View  Result Date: 10/23/2020 CLINICAL DATA:  CPR; post CPR after found unresponsive EXAM: PORTABLE CHEST 1 VIEW COMPARISON:  2015 FINDINGS: Endotracheal tube in satisfactory position. Enteric tube tip is at the gastroesophageal junction. Lung volumes are low. No consolidation. Probable patchy atelectasis. No pleural effusion. No pneumothorax. IMPRESSION: Endotracheal tube in place. Enteric tube tip at gastroesophageal junction. Low lung volumes with probable patchy atelectasis. Electronically Signed   By: Guadlupe Spanish M.D.   On: 10/23/2020 18:36     Echo ending  TELEMETRY: Sinus rhythm:  ASSESSMENT AND PLAN:  Active Problems:   STEMI involving oth coronary artery of inferior wall (HCC)   Acute respiratory failure with  hypoxia and hypercapnia (HCC)   Endotracheally intubated   On mechanically assisted ventilation (HCC)   Transaminitis   Shock (HCC)   Cardiac arrest (HCC)    1.  Inferior ST elevation myocardial infarction, with occluded proximal RCA, status post successful primary PCI with DES proximal RCA, with preserved left ventricular function 2.  Status post cardiac arrest, debated this morning, on amiodarone drip 3.  Anemia 4.  Elevated LFTs, in the setting of STEMI 5.  Tobacco abuse 6.  EtOH use, patient reports drinking 12 beers on weekend days   Recommendations   1.  Dual antiplatelet therapy uninterrupted for 1 year 2.  Start metoprolol to tartrate 25 mg twice daily 3.  Start lisinopril 2.5 mg daily 4.  Start high intensity atorvastatin 80 mg daily when LFTs improve 5.  Review 2D echocardiogram 6.  Asked patient to  stop smoking 7.  Advised patient limit EtOH   Marcina Millard, MD, PhD, [facility] 10/24/2020 10:02 AM
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
## 2020-10-25 ENCOUNTER — Inpatient Hospital Stay: Payer: Medicaid Other

## 2020-10-25 ENCOUNTER — Inpatient Hospital Stay
Admit: 2020-10-25 | Discharge: 2020-10-25 | Disposition: A | Discharge status: Home / Self Care | Payer: Medicaid Other | Attending: Cardiology | Admitting: Cardiology

## 2020-10-25 DIAGNOSIS — R7989 Other specified abnormal findings of blood chemistry: Secondary | ICD-10-CM

## 2020-10-25 LAB — CBC WITH DIFFERENTIAL/PLATELET
Abs Immature Granulocytes: 0.07 10*3/uL (ref 0.00–0.07)
Basophils Absolute: 0.1 10*3/uL (ref 0.0–0.1)
Basophils Relative: 0 %
Eosinophils Absolute: 0.5 10*3/uL (ref 0.0–0.5)
Eosinophils Relative: 3 %
HCT: 40 % (ref 39.0–52.0)
Hemoglobin: 14.3 g/dL (ref 13.0–17.0)
Immature Granulocytes: 0 %
Lymphocytes Relative: 24 %
Lymphs Abs: 4.1 10*3/uL — ABNORMAL HIGH (ref 0.7–4.0)
MCH: 31.8 pg (ref 26.0–34.0)
MCHC: 35.8 g/dL (ref 30.0–36.0)
MCV: 89.1 fL (ref 80.0–100.0)
Monocytes Absolute: 1.2 10*3/uL — ABNORMAL HIGH (ref 0.1–1.0)
Monocytes Relative: 7 %
Neutro Abs: 11.2 10*3/uL — ABNORMAL HIGH (ref 1.7–7.7)
Neutrophils Relative %: 66 %
Platelets: 157 10*3/uL (ref 150–400)
RBC: 4.49 MIL/uL (ref 4.22–5.81)
RDW: 13.5 % (ref 11.5–15.5)
WBC: 17.1 10*3/uL — ABNORMAL HIGH (ref 4.0–10.5)
nRBC: 0 % (ref 0.0–0.2)

## 2020-10-25 LAB — VITAMIN B12: Vitamin B-12: 341 pg/mL (ref 180–914)

## 2020-10-25 LAB — COMPREHENSIVE METABOLIC PANEL
ALT: 253 U/L — ABNORMAL HIGH (ref 0–44)
AST: 199 U/L — ABNORMAL HIGH (ref 15–41)
Albumin: 3.2 g/dL — ABNORMAL LOW (ref 3.5–5.0)
Alkaline Phosphatase: 40 U/L (ref 38–126)
Anion gap: 7 (ref 5–15)
BUN: 13 mg/dL (ref 6–20)
CO2: 24 mmol/L (ref 22–32)
Calcium: 8.5 mg/dL — ABNORMAL LOW (ref 8.9–10.3)
Chloride: 105 mmol/L (ref 98–111)
Creatinine, Ser: 0.99 mg/dL (ref 0.61–1.24)
GFR, Estimated: >60 mL/min (ref >60)
Glucose, Bld: 103 mg/dL — ABNORMAL HIGH (ref 70–99)
Potassium: 3.9 mmol/L (ref 3.5–5.1)
Sodium: 136 mmol/L (ref 135–145)
Total Bilirubin: 0.8 mg/dL (ref 0.3–1.2)
Total Protein: 5.6 g/dL — ABNORMAL LOW (ref 6.5–8.1)

## 2020-10-25 LAB — PROCALCITONIN: Procalcitonin: 5.88 ng/mL

## 2020-10-25 LAB — MAGNESIUM: Magnesium: 2 mg/dL (ref 1.7–2.4)

## 2020-10-25 LAB — PHOSPHORUS: Phosphorus: 2.1 mg/dL — ABNORMAL LOW (ref 2.5–4.6)

## 2020-10-25 MED ORDER — MELATONIN 5 MG PO TABS
2.5000 mg | ORAL_TABLET | Freq: Every day | ORAL | Status: DC
  Administered 2020-10-25 – 2020-10-26 (×2): 2.5 mg via ORAL
  Filled 2020-10-25 (×2): qty 1

## 2020-10-25 MED ORDER — K PHOS MONO-SOD PHOS DI & MONO 155-852-130 MG PO TABS
500.0000 mg | ORAL_TABLET | ORAL | Status: AC
Start: 2020-10-25 — End: 2020-10-25
  Administered 2020-10-25 (×3): 500 mg via ORAL
  Filled 2020-10-25 (×4): qty 2

## 2020-10-25 MED ORDER — POTASSIUM & SODIUM PHOSPHATES 280-160-250 MG PO PACK: 2.0000 | PACK | Freq: Three times a day (TID) | ORAL | Status: DC

## 2020-10-25 MED ORDER — PANTOPRAZOLE SODIUM 40 MG PO TBEC
40.0000 mg | DELAYED_RELEASE_TABLET | Freq: Every day | ORAL | Status: DC
  Administered 2020-10-26 – 2020-10-27 (×2): 40 mg via ORAL
  Filled 2020-10-25 (×2): qty 1

## 2020-10-25 MED ORDER — INFLUENZA VAC SPLIT QUAD 0.5 ML IM SUSY
0.5000 mL | PREFILLED_SYRINGE | INTRAMUSCULAR | Status: DC
  Filled 2020-10-25: qty 0.5

## 2020-10-25 NOTE — Consult Note (Signed)
PHARMACY CONSULT NOTE - FOLLOW UP  Pharmacy Consult for Electrolyte Monitoring and Replacement   Recent Labs: Potassium (mmol/L)  Date Value  10/25/2020 3.9   Magnesium (mg/dL)  Date Value  91/66/0600 2.0   Calcium (mg/dL)  Date Value  45/99/7741 8.5 (L)   Albumin (g/dL)  Date Value  42/39/5320 3.2 (L)   Phosphorus (mg/dL)  Date Value  23/34/3568 2.1 (L)   Sodium (mmol/L)  Date Value  10/25/2020 136     Assessment: 35 year old male presenting to [facility] ED on 10/23/2020 unresponsive in personal vehicle. S/p PCI with DES promximal RCA. On amio gtt.   Goal of Therapy:  WNL  Plan:  Will give [place] packets x 4  F/u with AM labs.   Ronnald Ramp ,PharmD Clinical Pharmacist 10/25/2020 8:45 AM

## 2020-10-25 NOTE — Progress Notes (Signed)
[facility] Cardiology  SUBJECTIVE: Patient laying in bed, reports feeling great, denies chest pain or shortness of breath   Vitals:   10/24/20 2000 10/25/20 0000 10/25/20 0400 10/25/20 0745  BP: 97/85 101/80 105/77 109/83  Pulse: 77 86 79 72  Resp: 19 17 16 14   Temp: 99.9 F (37.7 C) 99.7 F (37.6 C) 99.5 F (37.5 C) 99.1 F (37.3 C)  TempSrc: Temporal Oral Temporal Bladder  SpO2: 98% 97% 94% 96%  Weight:      Height:         Intake/Output Summary (Last 24 hours) at 10/25/2020 0944 Last data filed at 10/25/2020 0800 Gross per 24 hour  Intake 1018.51 ml  Output 1700 ml  Net -681.49 ml      PHYSICAL EXAM  General: Well developed, well nourished, in no acute distress HEENT:  Normocephalic and atramatic Neck:  No JVD.  Lungs: Clear bilaterally to auscultation and percussion. Heart: HRRR . Normal S1 and S2 without gallops or murmurs.  Abdomen: Bowel sounds are positive, abdomen soft and non-tender  Msk:  Back normal, normal gait. Normal strength and tone for age. Extremities: No clubbing, cyanosis or edema.   Neuro: Alert and oriented X 3. Psych:  Good affect, responds appropriately   LABS: Basic Metabolic Panel: Recent Labs    10/24/20 0450 10/24/20 0730 10/25/20 0455  NA 137 137 136  K 5.4* 4.9 3.9  CL 109 107 105  CO2 21* 21* 24  GLUCOSE 105* 104* 103*  BUN 19 18 13   CREATININE 1.07 1.06 0.99  CALCIUM 8.2* 8.0* 8.5*  MG 2.2  --  2.0  PHOS 2.2*  --  2.1*   Liver Function Tests: Recent Labs    10/24/20 0730 10/25/20 0455  AST 346* 199*  ALT 345* 253*  ALKPHOS 45 40  BILITOT 0.6 0.8  PROT 6.3* 5.6*  ALBUMIN 3.7 3.2*   No results for input(s): LIPASE, AMYLASE in the last 72 hours. CBC: Recent Labs    10/24/20 0450 10/25/20 0455  WBC 18.6* 17.1*  NEUTROABS  --  11.2*  HGB 15.1 14.3  HCT 41.7 40.0  MCV 89.3 89.1  PLT 187 157   Cardiac Enzymes: No results for input(s): CKTOTAL, CKMB, CKMBINDEX, TROPONINI in the last 72 hours. BNP: Invalid  input(s): POCBNP D-Dimer: No results for input(s): DDIMER in the last 72 hours. Hemoglobin A1C: Recent Labs    10/24/20 0730  HGBA1C 5.0   Fasting Lipid Panel: Recent Labs    10/24/20 0450  CHOL 176  HDL 27*  LDLCALC 118*  TRIG 148  156*  CHOLHDL 6.5   Thyroid Function Tests: No results for input(s): TSH, T4TOTAL, T3FREE, THYROIDAB in the last 72 hours.  Invalid input(s): FREET3 Anemia Panel: No results for input(s): VITAMINB12, FOLATE, FERRITIN, TIBC, IRON, RETICCTPCT in the last 72 hours.  DG Abd 1 View  Result Date: 10/23/2020 CLINICAL DATA:  Orogastric tube placement. EXAM: ABDOMEN - 1 VIEW COMPARISON:  None. FINDINGS: Enteric tube noted with tip and side port overlying the expected region of the gastric lumen. The bowel gas pattern is normal. Excreted intravenous contrast noted within the kidneys. No radio-opaque calculi or other significant radiographic abnormality are seen. IMPRESSION: Enteric tube in grossly appropriate position. Electronically Signed   By: 10/26/20 M.D.   On: 10/23/2020 23:02   DG Abd 1 View  Result Date: 10/23/2020 CLINICAL DATA:  OG tube placement EXAM: ABDOMEN - 1 VIEW COMPARISON:  10/23/2020 FINDINGS: Esophageal tube tip overlies the GE junction  region, side-port at the level of distal esophagus. Contrast within the renal collecting systems. IMPRESSION: 1. Esophageal tube tip at GE junction region, side-port in the region of distal esophagus, consider advancement by 10-15 cm for more optimal positioning. These results will be called to the ordering clinician or representative by the Radiologist Assistant, and communication documented in the PACS or [employer]. Electronically Signed   By: Jasmine Pang M.D.   On: 10/23/2020 21:39   CARDIAC CATHETERIZATION  Result Date: 10/23/2020   Prox RCA-1 lesion is 100% stenosed.   Mid Cx to Dist Cx lesion is 30% stenosed.   Prox RCA-2 lesion is 50% stenosed.   RPDA lesion is 100% stenosed.   A  drug-eluting stent was successfully placed using a STENT ONYX FRONTIER 3.0X18.   Post intervention, there is a 0% residual stenosis.   LV end diastolic pressure is normal.   The left ventricular ejection fraction is 50-55% by visual estimate. 1.  Inferior ST elevation myocardial infarction 2.  Successful primary PCI with DES ostial/proximal RCA 3.  Preserved left ventricular function, with estimated LV ejection fraction 30% Recommendations 1.  Dual antiplatelet therapy uninterrupted for 1 year 2.  High intensity atorvastatin 3.  2D echocardiogram   US Venous Img Lower Bilateral (DVT)  Result Date: 10/25/2020 CLINICAL DATA:  Ischemic rest pain of lower extremity. EXAM: BILATERAL LOWER EXTREMITY VENOUS DOPPLER ULTRASOUND TECHNIQUE: Gray-scale sonography with graded compression, as well as color Doppler and duplex ultrasound were performed to evaluate the lower extremity deep venous systems from the level of the common femoral vein and including the common femoral, femoral, profunda femoral, popliteal and calf veins including the posterior tibial, peroneal and gastrocnemius veins when visible. The superficial great saphenous vein was also interrogated. Spectral Doppler was utilized to evaluate flow at rest and with distal augmentation maneuvers in the common femoral, femoral and popliteal veins. COMPARISON:  None. FINDINGS: RIGHT LOWER EXTREMITY Common Femoral Vein: No evidence of thrombus. Normal compressibility, respiratory phasicity and response to augmentation. Saphenofemoral Junction: No evidence of thrombus. Normal compressibility and flow on color Doppler imaging. Profunda Femoral Vein: No evidence of thrombus. Normal compressibility and flow on color Doppler imaging. Femoral Vein: No evidence of thrombus. Normal compressibility, respiratory phasicity and response to augmentation. Popliteal Vein: No evidence of thrombus. Normal compressibility, respiratory phasicity and response to augmentation. Calf Veins:  No evidence of thrombus. Normal compressibility and flow on color Doppler imaging. Other Findings:  None. LEFT LOWER EXTREMITY Common Femoral Vein: No evidence of thrombus. Normal compressibility, respiratory phasicity and response to augmentation. Saphenofemoral Junction: No evidence of thrombus. Normal compressibility and flow on color Doppler imaging. Profunda Femoral Vein: No evidence of thrombus. Normal compressibility and flow on color Doppler imaging. Femoral Vein: No evidence of thrombus. Normal compressibility, respiratory phasicity and response to augmentation. Popliteal Vein: No evidence of thrombus. Normal compressibility, respiratory phasicity and response to augmentation. Calf Veins: No evidence of thrombus. Normal compressibility and flow on color Doppler imaging. Other Findings:  None. IMPRESSION: No evidence of deep venous thrombosis in either lower extremity. Electronically Signed   By: Richarda Overlie M.D.   On: 10/25/2020 08:55   DG Chest Port 1 View  Result Date: 10/24/2020 CLINICAL DATA:  Endotracheal tube EXAM: PORTABLE CHEST 1 VIEW COMPARISON:  Chest radiograph 1 day prior FINDINGS: The endotracheal tube is in the lower thoracic trachea approximately 1.1 cm from the carina. The enteric catheter courses off the field of view. Multiple additional leads and defibrillator pad overlie the  chest. The cardiomediastinal silhouette is stable. Lung aeration is not significantly changed, with no new focal airspace opacity, pleural effusion, or pneumothorax. There is no acute osseous abnormality. IMPRESSION: 1. Endotracheal tube approximately 1.1 cm from the carina. 2. Unchanged lung aeration. Electronically Signed   By: Lesia Hausen M.D.   On: 10/24/2020 08:03   DG Chest Port 1 View  Result Date: 10/23/2020 CLINICAL DATA:  CPR; post CPR after found unresponsive EXAM: PORTABLE CHEST 1 VIEW COMPARISON:  2015 FINDINGS: Endotracheal tube in satisfactory position. Enteric tube tip is at the  gastroesophageal junction. Lung volumes are low. No consolidation. Probable patchy atelectasis. No pleural effusion. No pneumothorax. IMPRESSION: Endotracheal tube in place. Enteric tube tip at gastroesophageal junction. Low lung volumes with probable patchy atelectasis. Electronically Signed   By: Guadlupe Spanish M.D.   On: 10/23/2020 18:36     Echo pending  TELEMETRY: Sinus rhythm:  ASSESSMENT AND PLAN:  Active Problems:   STEMI involving oth coronary artery of inferior wall (HCC)   Acute respiratory failure with hypoxia and hypercapnia (HCC)   Endotracheally intubated   On mechanically assisted ventilation (HCC)   Transaminitis   Shock (HCC)   Cardiac arrest (HCC)    1.  Inferior ST elevation myocardial infarction, with occluded proximal RCA, status post successful primary PCI with DES proximal RCA, with preserved left ventricular function 2.  Status post cardiac arrest, debated this morning, on amiodarone drip 3.  Elevated LFTs, in the setting of STEMI 4.  Tobacco abuse 5.  EtOH use, patient reports drinking 12 beers on weekend days   Recommendations   1.  Dual antiplatelet therapy uninterrupted for 1 year 2.  Continue metoprolol to tartrate 25 mg twice daily 3.  Uptitrate lisinopril to 5 mg daily 4.  Start high intensity atorvastatin 80 mg daily when LFTs improve, as outpatient 5.  Review 2D echocardiogram 6.  Asked patient to stop smoking 7.  Advised patient limit EtOH 8.  DC amiodarone drip 9.  May transfer to PCU/telemetry 10.  Probable discharge in a.m. 11.  Follow-up with me in 1 to 2-weeks     Marcina Millard, MD, PhD, [facility] 10/25/2020 9:44 AM

## 2020-10-25 NOTE — Progress Notes (Signed)
*  PRELIMINARY RESULTS* Echocardiogram 2D Echocardiogram has been performed.  [name] 10/25/2020, 3:53 PM

## 2020-10-25 NOTE — Progress Notes (Signed)
Pt transferred to 2A, report given to RN. VSS, no other issues noted. Pt and spouse updated with plan of care.

## 2020-10-25 NOTE — Progress Notes (Signed)
PROGRESS NOTE    [name]  WPY:[id number] DOB: 30-Oct-1985 DOA: 10/23/2020 PCP: Pcp, No    Brief Narrative:  35 year old male, was brought to the emergency room in private vehicle when he was noted to have chest pain.  His wife reports that he began passing out in the car.  On arrival to the emergency room, he was noted to be pulseless and CPR was initiated.  The patient was resuscitated.  He underwent cardiac catheterization and noted to have occluded RCA.  He underwent revascularization with stent placement.  Post procedure, the patient began to quickly improved.  He was able to come off the ventilator and is now awake and alert.   Assessment & Plan:   Active Problems:   STEMI involving oth coronary artery of inferior wall (HCC)   Acute respiratory failure with hypoxia and hypercapnia (HCC)   Endotracheally intubated   On mechanically assisted ventilation (HCC)   Transaminitis   Shock ([place])   Cardiac arrest ([place])   Inferior ST elevation MI -Patient underwent cardiac catheterization with successful primary PCI with DES to proximal RCA -Continue aspirin and Brilinta -Started on lisinopril and metoprolol -Can consider starting statin as an outpatient once LFTs have improved  Status post cardiac arrest -Likely related to STEMI -Review of record indicates that patient presented in asystole and CPR was initiated.  He was subsequently noted to be in ventricular fibrillation and was shocked -CPR was continued for what appears to be approximately 25 minutes after which ROSC was achieved -Post resuscitation he was started on amiodarone, which was discontinued on 9/18  Acute respiratory failure with hypoxia -Related to cardiac arrest -Patient intubated and mechanically ventilated -He was extubated on 9/17 -Current respiratory status appears to be stable  Aspiration pneumonia -Had vomiting episode prior to intubation -Noted to have gastric contents and airway during  intubation -Started on Unasyn  Elevated liver enzymes -Suspect reactive to underlying hemodynamic instability during resuscitation -He does complain of right upper quadrant pain which becomes worse when he tries to eat -We will check right upper quadrant ultrasound -Overall LFTs improving  Circulatory shock -Related to MI -Briefly required vasopressors including norepinephrine -Since then, hemodynamics have stabilized and he is off pressors now  New neurologic deficits -Reports decrease sensation in his right lower extremity as well as blurry vision -May have some sequelae from decreased cerebral perfusion during cardiac arrest -Check MRI brain  History of possible Raynaud's disease -Describes that he often has pain and discoloration of his fingers during cold weather -We will check ANA -Reports that his father has a history of factor V Leiden mutation -This may have played a part in his underlying MI -We will check factor V Leiden    DVT prophylaxis: enoxaparin (LOVENOX) injection 40 mg Start: 10/24/20 2200 SCDs Start: 10/23/20 2240  Code Status: Full code Family Communication: Discussed with patient's wife at the bedside Disposition Plan: Status is: Inpatient  Remains inpatient appropriate because:Ongoing diagnostic testing needed not appropriate for outpatient work up and Inpatient level of care appropriate due to severity of illness  Dispo: The patient is from: Home              Anticipated d/c is to: Home              Patient currently is not medically stable to d/c.   Difficult to place patient No         Consultants:  Cardiology PCCM  Procedures:  Cardiac catheterization  Antimicrobials:  Unasyn  9/16 >   Subjective: Patient is seen laying in bed.  He does complain of some blurry vision.  He describes circumferential decreased sensation in his right lower extremity, distally from his knee.  His wife notes that he repeatedly will ask the same  questions at times.  Objective: Vitals:   10/25/20 1300 10/25/20 1400 10/25/20 1442 10/25/20 1555  BP: 106/76 111/78 114/87   Pulse: 73 71 78   Resp: 18 12 20    Temp:   98.2 F (36.8 C)   TempSrc:   Oral   SpO2: 98% 95% 99%   Weight:    92.7 kg  Height:    5' 10"  (1.778 m)    Intake/Output Summary (Last 24 hours) at 10/25/2020 1817 Last data filed at 10/25/2020 1400 Gross per 24 hour  Intake 1358.77 ml  Output 1900 ml  Net -541.23 ml   Filed Weights   10/23/20 1759 10/24/20 0321 10/25/20 1555  Weight: 97.9 kg 82.1 kg 92.7 kg    Examination:  General exam: Appears calm and comfortable  Respiratory system: Clear to auscultation. Respiratory effort normal. Cardiovascular system: S1 & S2 heard, RRR. No JVD, murmurs, rubs, gallops or clicks. No pedal edema. Gastrointestinal system: Abdomen is nondistended, soft and mild tenderness right upper quadrant. No organomegaly or masses felt. Normal bowel sounds heard. Central nervous system: Alert and oriented.  Strength is equal bilaterally, plantars are downgoing, diminished sensation in right lower leg, extraocular motions are intact, pupils are equal bilaterally Extremities: Bruising noted in right groin, without any underlying hematoma.  Strong pulses bilaterally. Skin: No rashes, lesions or ulcers Psychiatry: Judgement and insight appear normal. Mood & affect appropriate.     Data Reviewed: I have personally reviewed following labs and imaging studies  CBC: Recent Labs  Lab 10/23/20 1733 10/24/20 0450 10/25/20 0455  WBC 6.3 18.6* 17.1*  NEUTROABS  --   --  11.2*  HGB 9.4* 15.1 14.3  HCT 27.6* 41.7 40.0  MCV 95.2 89.3 89.1  PLT 78* 187 517   Basic Metabolic Panel: Recent Labs  Lab 10/23/20 1734 10/23/20 2158 10/24/20 0450 10/24/20 0730 10/25/20 0455  NA 148*  --  137 137 136  K <2.0* 3.5 5.4* 4.9 3.9  CL 121*  --  109 107 105  CO2 16*  --  21* 21* 24  GLUCOSE 126*  --  105* 104* 103*  BUN 9  --  19 18 13    CREATININE 0.65  --  1.07 1.06 0.99  CALCIUM 5.1*  --  8.2* 8.0* 8.5*  MG  --  2.4 2.2  --  2.0  PHOS  --  4.4 2.2*  --  2.1*   GFR: Estimated Creatinine Clearance: 119.2 mL/min (by C-G formula based on SCr of 0.99 mg/dL). Liver Function Tests: Recent Labs  Lab 10/23/20 1734 10/24/20 0450 10/24/20 0730 10/25/20 0455  AST 75* 395* 346* 199*  ALT 111* 337* 345* 253*  ALKPHOS 24* 45 45 40  BILITOT 0.4 0.7 0.6 0.8  PROT 3.5* 6.3* 6.3* 5.6*  ALBUMIN 2.0* 3.5 3.7 3.2*   No results for input(s): LIPASE, AMYLASE in the last 168 hours. No results for input(s): AMMONIA in the last 168 hours. Coagulation Profile: Recent Labs  Lab 10/23/20 1734  INR 1.5*   Cardiac Enzymes: No results for input(s): CKTOTAL, CKMB, CKMBINDEX, TROPONINI in the last 168 hours. BNP (last 3 results) No results for input(s): PROBNP in the last 8760 hours. HbA1C: Recent Labs    10/23/20 2158  10/24/20 0730  HGBA1C 5.4 5.0   CBG: Recent Labs  Lab 10/23/20 2318 10/24/20 0315 10/24/20 0735 10/24/20 1100 10/24/20 1623  GLUCAP 145* 105* 98 119* 110*   Lipid Profile: Recent Labs    10/24/20 0450  CHOL 176  HDL 27*  LDLCALC 118*  TRIG 148  156*  CHOLHDL 6.5   Thyroid Function Tests: No results for input(s): TSH, T4TOTAL, FREET4, T3FREE, THYROIDAB in the last 72 hours. Anemia Panel: No results for input(s): VITAMINB12, FOLATE, FERRITIN, TIBC, IRON, RETICCTPCT in the last 72 hours. Sepsis Labs: Recent Labs  Lab 10/23/20 2158 10/25/20 0455  PROCALCITON 1.94 5.88    Recent Results (from the past 240 hour(s))  MRSA Next Gen by PCR, Nasal     Status: None   Collection Time: 10/23/20  8:35 PM   Specimen: Nasal Mucosa; Nasal Swab  Result Value Ref Range Status   MRSA by PCR Next Gen NOT DETECTED NOT DETECTED Final    Comment: (NOTE) The GeneXpert MRSA Assay (FDA approved for NASAL specimens only), is one component of a comprehensive MRSA colonization surveillance program. It is not  intended to diagnose MRSA infection nor to guide or monitor treatment for MRSA infections. Test performance is not FDA approved in patients less than 56 years old. Performed at [facility], [place],  [address]   Resp Panel by RT-PCR (Flu A&B, Covid) Nasopharyngeal Swab     Status: None   Collection Time: 10/23/20  8:35 PM   Specimen: Nasopharyngeal Swab; Nasopharyngeal(NP) swabs in vial transport medium  Result Value Ref Range Status   SARS Coronavirus 2 by RT PCR NEGATIVE NEGATIVE Final    Comment: (NOTE) SARS-CoV-2 target nucleic acids are NOT DETECTED.  The SARS-CoV-2 RNA is generally detectable in upper respiratory specimens during the acute phase of infection. The lowest concentration of SARS-CoV-2 viral copies this assay can detect is 138 copies/mL. A negative result does not preclude SARS-Cov-2 infection and should not be used as the sole basis for treatment or other patient management decisions. A negative result may occur with  improper specimen collection/handling, submission of specimen other than nasopharyngeal swab, presence of viral mutation(s) within the areas targeted by this assay, and inadequate number of viral copies(<138 copies/mL). A negative result must be combined with clinical observations, patient history, and epidemiological information. The expected result is Negative.  Fact Sheet for Patients:  EntrepreneurPulse.com.au  Fact Sheet for Healthcare Providers:  IncredibleEmployment.be  This test is no t yet approved or cleared by the [place] FDA and  has been authorized for detection and/or diagnosis of SARS-CoV-2 by FDA under an Emergency Use Authorization (EUA). This EUA will remain  in effect (meaning this test can be used) for the duration of the COVID-19 declaration under Section 564(b)(1) of the Act, 21 U.S.C.section 360bbb-3(b)(1), unless the authorization is terminated  or  revoked sooner.       Influenza A by PCR NEGATIVE NEGATIVE Final   Influenza B by PCR NEGATIVE NEGATIVE Final    Comment: (NOTE) The Xpert Xpress SARS-CoV-2/FLU/RSV plus assay is intended as an aid in the diagnosis of influenza from Nasopharyngeal swab specimens and should not be used as a sole basis for treatment. Nasal washings and aspirates are unacceptable for Xpert Xpress SARS-CoV-2/FLU/RSV testing.  Fact Sheet for Patients: EntrepreneurPulse.com.au  Fact Sheet for Healthcare Providers: IncredibleEmployment.be  This test is not yet approved or cleared by the [place] FDA and has been authorized for detection and/or diagnosis of SARS-CoV-2  by FDA under an Emergency Use Authorization (EUA). This EUA will remain in effect (meaning this test can be used) for the duration of the COVID-19 declaration under Section 564(b)(1) of the Act, 21 U.S.C. section 360bbb-3(b)(1), unless the authorization is terminated or revoked.  Performed at [facility], [address] [place], [place] [address]   Culture, Respiratory w Gram Stain     Status: None (Preliminary result)   Collection Time: 10/24/20  3:36 AM   Specimen: Tracheal Aspirate; Respiratory  Result Value Ref Range Status   Specimen Description   Final    TRACHEAL ASPIRATE Performed at [facility], [address] [place], [place] [address]    Special Requests   Final    NONE Performed at [facility], [place], [place] [address]    Gram Stain   Final    MODERATE WBC PRESENT,BOTH PMN AND MONONUCLEAR ABUNDANT GRAM POSITIVE COCCI IN CLUSTERS FEW GRAM POSITIVE COCCI IN PAIRS AND CHAINS FEW GRAM VARIABLE ROD    Culture   Final    CULTURE REINCUBATED FOR BETTER GROWTH Performed at [facility], [place] [address] [place], [place] [address]    Report Status PENDING  Incomplete         Radiology Studies: DG Abd 1 View  Result  Date: 10/23/2020 CLINICAL DATA:  Orogastric tube placement. EXAM: ABDOMEN - 1 VIEW COMPARISON:  None. FINDINGS: Enteric tube noted with tip and side port overlying the expected region of the gastric lumen. The bowel gas pattern is normal. Excreted intravenous contrast noted within the kidneys. No radio-opaque calculi or other significant radiographic abnormality are seen. IMPRESSION: Enteric tube in grossly appropriate position. Electronically Signed   By: Iven Finn M.D.   On: 10/23/2020 23:02   DG Abd 1 View  Result Date: 10/23/2020 CLINICAL DATA:  OG tube placement EXAM: ABDOMEN - 1 VIEW COMPARISON:  10/23/2020 FINDINGS: Esophageal tube tip overlies the GE junction region, side-port at the level of distal esophagus. Contrast within the renal collecting systems. IMPRESSION: 1. Esophageal tube tip at GE junction region, side-port in the region of distal esophagus, consider advancement by 10-15 cm for more optimal positioning. These results will be called to the ordering clinician or representative by the Radiologist Assistant, and communication documented in the PACS or [employer]. Electronically Signed   By: Donavan Foil M.D.   On: 10/23/2020 21:39   MR BRAIN WO CONTRAST  Result Date: 10/25/2020 CLINICAL DATA:  Loss of consciousness, evaluate for infarct EXAM: MRI HEAD WITHOUT CONTRAST TECHNIQUE: Multiplanar, multiecho pulse sequences of the brain and surrounding structures were obtained without intravenous contrast. COMPARISON:  None. FINDINGS: Brain: There is no evidence of acute intracranial hemorrhage, extra-axial fluid collection, or acute infarct. There is a single small focus of FLAIR signal abnormality in the right corona radiata, nonspecific. There is no suspicious parenchymal signal abnormality. The ventricles are normal in size. The basal cisterns are patent. There is no mass lesion. There is no midline shift. Vascular: Normal flow voids. Skull and upper cervical spine: Normal marrow  signal. Sinuses/Orbits: The imaged paranasal sinuses are clear. The globes and orbits are unremarkable. Other: None. IMPRESSION: Unremarkable brain MRI. No evidence of acute infarct or anoxic injury. Electronically Signed   By: Valetta Mole M.D.   On: 10/25/2020 16:55   CARDIAC CATHETERIZATION  Result Date: 10/23/2020   Prox RCA-1 lesion is 100% stenosed.   Mid Cx to Dist Cx lesion is 30% stenosed.  Prox RCA-2 lesion is 50% stenosed.   RPDA lesion is 100% stenosed.   A drug-eluting stent was successfully placed using a STENT ONYX FRONTIER [id number].   Post intervention, there is a 0% residual stenosis.   LV end diastolic pressure is normal.   The left ventricular ejection fraction is 50-55% by visual estimate. 1.  Inferior ST elevation myocardial infarction 2.  Successful primary PCI with DES ostial/proximal RCA 3.  Preserved left ventricular function, with estimated LV ejection fraction 30% Recommendations 1.  Dual antiplatelet therapy uninterrupted for 1 year 2.  High intensity atorvastatin 3.  2D echocardiogram   US Venous Img Lower Bilateral (DVT)  Result Date: 10/25/2020 CLINICAL DATA:  Ischemic rest pain of lower extremity. EXAM: BILATERAL LOWER EXTREMITY VENOUS DOPPLER ULTRASOUND TECHNIQUE: Gray-scale sonography with graded compression, as well as color Doppler and duplex ultrasound were performed to evaluate the lower extremity deep venous systems from the level of the common femoral vein and including the common femoral, femoral, profunda femoral, popliteal and calf veins including the posterior tibial, peroneal and gastrocnemius veins when visible. The superficial great saphenous vein was also interrogated. Spectral Doppler was utilized to evaluate flow at rest and with distal augmentation maneuvers in the common femoral, femoral and popliteal veins. COMPARISON:  None. FINDINGS: RIGHT LOWER EXTREMITY Common Femoral Vein: No evidence of thrombus. Normal compressibility, respiratory phasicity and  response to augmentation. Saphenofemoral Junction: No evidence of thrombus. Normal compressibility and flow on color Doppler imaging. Profunda Femoral Vein: No evidence of thrombus. Normal compressibility and flow on color Doppler imaging. Femoral Vein: No evidence of thrombus. Normal compressibility, respiratory phasicity and response to augmentation. Popliteal Vein: No evidence of thrombus. Normal compressibility, respiratory phasicity and response to augmentation. Calf Veins: No evidence of thrombus. Normal compressibility and flow on color Doppler imaging. Other Findings:  None. LEFT LOWER EXTREMITY Common Femoral Vein: No evidence of thrombus. Normal compressibility, respiratory phasicity and response to augmentation. Saphenofemoral Junction: No evidence of thrombus. Normal compressibility and flow on color Doppler imaging. Profunda Femoral Vein: No evidence of thrombus. Normal compressibility and flow on color Doppler imaging. Femoral Vein: No evidence of thrombus. Normal compressibility, respiratory phasicity and response to augmentation. Popliteal Vein: No evidence of thrombus. Normal compressibility, respiratory phasicity and response to augmentation. Calf Veins: No evidence of thrombus. Normal compressibility and flow on color Doppler imaging. Other Findings:  None. IMPRESSION: No evidence of deep venous thrombosis in either lower extremity. Electronically Signed   By: Markus Daft M.D.   On: 10/25/2020 08:55   DG Chest Port 1 View  Result Date: 10/24/2020 CLINICAL DATA:  Endotracheal tube EXAM: PORTABLE CHEST 1 VIEW COMPARISON:  Chest radiograph 1 day prior FINDINGS: The endotracheal tube is in the lower thoracic trachea approximately 1.1 cm from the carina. The enteric catheter courses off the field of view. Multiple additional leads and defibrillator pad overlie the chest. The cardiomediastinal silhouette is stable. Lung aeration is not significantly changed, with no new focal airspace opacity,  pleural effusion, or pneumothorax. There is no acute osseous abnormality. IMPRESSION: 1. Endotracheal tube approximately 1.1 cm from the carina. 2. Unchanged lung aeration. Electronically Signed   By: Valetta Mole M.D.   On: 10/24/2020 08:03   DG Foot 2 Views Left  Result Date: 10/25/2020 CLINICAL DATA:  LEFT fifth fifth digit pain EXAM: LEFT FOOT - 2 VIEW COMPARISON:  None. FINDINGS: No fracture or dislocation of mid foot or forefoot. The phalanges are normal. The calcaneus is normal. No soft tissue abnormality.  Remote screw fixation of the medial malleolus IMPRESSION: No acute osseous abnormality. Electronically Signed   By: Suzy Bouchard M.D.   On: 10/25/2020 17:05        Scheduled Meds:  aspirin  81 mg Oral Daily   chlorhexidine  15 mL Mouth Rinse BID   chlorhexidine gluconate (MEDLINE KIT)  15 mL Mouth Rinse BID   Chlorhexidine Gluconate Cloth  6 each Topical Daily   enoxaparin (LOVENOX) injection  40 mg Subcutaneous Q24H   [START ON 10/26/2020] influenza vac split quadrivalent PF  0.5 mL Intramuscular Tomorrow-1000   lisinopril  2.5 mg Oral Daily   mouth rinse  15 mL Mouth Rinse q12n4p   metoprolol tartrate  25 mg Oral BID   pantoprazole (PROTONIX) IV  40 mg Intravenous Q24H   phosphorus  500 mg Oral Q4H   sodium chloride flush  3 mL Intravenous Q12H   ticagrelor  90 mg Oral BID   Continuous Infusions:  sodium chloride     sodium chloride 250 mL (10/25/20 1720)   ampicillin-sulbactam (UNASYN) IV 3 g (10/25/20 1724)     LOS: 2 days    Time spent: 34mns     JKathie Dike MD [facility]   If 7PM-7AM, please contact night-coverage [email or web address]  10/25/2020, 6:17 PM

## 2020-10-26 ENCOUNTER — Inpatient Hospital Stay: Payer: Medicaid Other

## 2020-10-26 ENCOUNTER — Encounter: Payer: Self-pay | Admitting: Cardiology

## 2020-10-26 DIAGNOSIS — I998 Other disorder of circulatory system: Secondary | ICD-10-CM

## 2020-10-26 DIAGNOSIS — M79606 Pain in leg, unspecified: Secondary | ICD-10-CM

## 2020-10-26 LAB — CBC
HCT: 35 % — ABNORMAL LOW (ref 39.0–52.0)
Hemoglobin: 12.5 g/dL — ABNORMAL LOW (ref 13.0–17.0)
MCH: 32.3 pg (ref 26.0–34.0)
MCHC: 35.7 g/dL (ref 30.0–36.0)
MCV: 90.4 fL (ref 80.0–100.0)
Platelets: 136 10*3/uL — ABNORMAL LOW (ref 150–400)
RBC: 3.87 MIL/uL — ABNORMAL LOW (ref 4.22–5.81)
RDW: 13.4 % (ref 11.5–15.5)
WBC: 12 10*3/uL — ABNORMAL HIGH (ref 4.0–10.5)
nRBC: 0 % (ref 0.0–0.2)

## 2020-10-26 LAB — CULTURE, RESPIRATORY W GRAM STAIN: Culture: NORMAL

## 2020-10-26 LAB — HEPATIC FUNCTION PANEL
ALT: 171 U/L — ABNORMAL HIGH (ref 0–44)
AST: 117 U/L — ABNORMAL HIGH (ref 15–41)
Albumin: 3.3 g/dL — ABNORMAL LOW (ref 3.5–5.0)
Alkaline Phosphatase: 35 U/L — ABNORMAL LOW (ref 38–126)
Bilirubin, Direct: <0.1 mg/dL (ref 0.0–0.2)
Total Bilirubin: 0.9 mg/dL (ref 0.3–1.2)
Total Protein: 5.8 g/dL — ABNORMAL LOW (ref 6.5–8.1)

## 2020-10-26 LAB — PROCALCITONIN: Procalcitonin: 3.02 ng/mL

## 2020-10-26 LAB — BASIC METABOLIC PANEL
Anion gap: 4 — ABNORMAL LOW (ref 5–15)
BUN: 17 mg/dL (ref 6–20)
CO2: 25 mmol/L (ref 22–32)
Calcium: 8.5 mg/dL — ABNORMAL LOW (ref 8.9–10.3)
Chloride: 109 mmol/L (ref 98–111)
Creatinine, Ser: 1.02 mg/dL (ref 0.61–1.24)
GFR, Estimated: >60 mL/min (ref >60)
Glucose, Bld: 97 mg/dL (ref 70–99)
Potassium: 4.1 mmol/L (ref 3.5–5.1)
Sodium: 138 mmol/L (ref 135–145)

## 2020-10-26 LAB — ECHOCARDIOGRAM COMPLETE
AR max vel: 2.52 cm2
AV Peak grad: 4.2 mmHg
Ao pk vel: 1.02 m/s
Area-P 1/2: 2.68 cm2
Height: 67 in
S' Lateral: 4.2 cm
Weight: 2895.96 oz

## 2020-10-26 LAB — PHOSPHORUS: Phosphorus: 2.7 mg/dL (ref 2.5–4.6)

## 2020-10-26 LAB — GLUCOSE, CAPILLARY: Glucose-Capillary: 260 mg/dL — ABNORMAL HIGH (ref 70–99)

## 2020-10-26 LAB — ANA W/REFLEX IF POSITIVE: Anti Nuclear Antibody (ANA): NEGATIVE

## 2020-10-26 MED ORDER — LISINOPRIL 5 MG PO TABS: 5.0000 mg | ORAL_TABLET | Freq: Every day | ORAL | 1 refills | Status: DC

## 2020-10-26 MED ORDER — AMOXICILLIN-POT CLAVULANATE 875-125 MG PO TABS
1.0000 | ORAL_TABLET | Freq: Two times a day (BID) | ORAL | Status: DC
  Administered 2020-10-26 – 2020-10-27 (×2): 1 via ORAL
  Filled 2020-10-26 (×4): qty 1

## 2020-10-26 MED ORDER — METOPROLOL TARTRATE 25 MG PO TABS: 25.0000 mg | ORAL_TABLET | Freq: Two times a day (BID) | ORAL | 1 refills | Status: DC

## 2020-10-26 MED ORDER — ASPIRIN 81 MG PO CHEW: 81.0000 mg | CHEWABLE_TABLET | Freq: Every day | ORAL | 2 refills | Status: DC

## 2020-10-26 MED ORDER — AMOXICILLIN-POT CLAVULANATE 875-125 MG PO TABS: 1.0000 | ORAL_TABLET | Freq: Two times a day (BID) | ORAL | 0 refills | Status: DC

## 2020-10-26 MED ORDER — TICAGRELOR 90 MG PO TABS: 90.0000 mg | ORAL_TABLET | Freq: Two times a day (BID) | ORAL | 1 refills | Status: DC

## 2020-10-26 MED ORDER — MAGNESIUM HYDROXIDE 400 MG/5ML PO SUSP
30.0000 mL | Freq: Every day | ORAL | Status: DC
  Administered 2020-10-26 – 2020-10-27 (×2): 30 mL via ORAL
  Filled 2020-10-26 (×2): qty 30

## 2020-10-26 NOTE — Consult Note (Signed)
PHARMACY CONSULT NOTE - FOLLOW UP  Pharmacy Consult for Electrolyte Monitoring and Replacement   Recent Labs: Potassium (mmol/L)  Date Value  10/26/2020 4.1   Magnesium (mg/dL)  Date Value  03/20/1733 2.0   Calcium (mg/dL)  Date Value  67/02/4101 8.5 (L)   Albumin (g/dL)  Date Value  03/09/4386 3.3 (L)   Phosphorus (mg/dL)  Date Value  87/57/9728 2.7   Sodium (mmol/L)  Date Value  10/26/2020 138   Corrected calcium: 9.1   Assessment: 35 year old male presenting to [facility] ED on 10/23/2020 unresponsive in personal vehicle. S/p PCI with DES promximal RCA on 9/17.  Goal of Therapy:  WNL  Plan:  No replacement indicated today F/u with AM labs.   Doloris Hall, PharmD Pharmacy Resident  10/26/2020 10:04 AM

## 2020-10-26 NOTE — Plan of Care (Addendum)
Nutrition Education Note  RD consulted for nutrition education regarding a Heart Healthy diet.   35 y/o male admitted with NSTEMI   Lipid Panel     Component Value Date/Time   CHOL 176 10/24/2020 0450   TRIG 148 10/24/2020 0450   TRIG 156 (H) 10/24/2020 0450   HDL 27 (L) 10/24/2020 0450   CHOLHDL 6.5 10/24/2020 0450   VLDL 31 10/24/2020 0450   LDLCALC 118 (H) 10/24/2020 0450   RD provided "Heart Healthy Nutrition Therapy" handout from the Academy of Nutrition and Dietetics. Reviewed patient's dietary recall. Provided examples on ways to decrease sodium and fat intake in diet. Discouraged intake of processed foods and use of salt shaker. Encouraged fresh fruits and vegetables as well as whole grain sources of carbohydrates to maximize fiber intake. Encouraged daily weights and compliance with medications. Teach back method used.  Expect good compliance.  Body mass index is 29.45 kg/m. Pt meets criteria for overweight based on current BMI.  Current diet order is HH, patient is consuming approximately 100% of meals at this time. Labs and medications reviewed. No further nutrition interventions warranted at this time. RD contact information provided. If additional nutrition issues arise, please re-consult RD.  Michael Holiday MS, RD, LDN Please refer to [facility] for RD and/or RD on-call/weekend/after hours pager

## 2020-10-26 NOTE — Progress Notes (Signed)
[name] came in d/t an NSTEMI with complete occlusion of RCA. Stent placed. [name] is A& O X4. [name]'s spouse [name] is present in room. Plan is for [name] to be discharged home in a day or 2 once IV antibiotics are complete

## 2020-10-26 NOTE — Progress Notes (Signed)
Patient left room at 7:40 am for an ultrasound of the RUQ. Returned to room at 8:15 am. Ordered breakfast after procedure.

## 2020-10-26 NOTE — Progress Notes (Signed)
Leader rounding just updated the status of this patient. He will not be discharged today. He will be kept overnight for assessment and then be discharged tomorrow.

## 2020-10-26 NOTE — Progress Notes (Signed)
Dr. Kerry Hough spoke to neurology and he agreed that no further inpatient neuro work up was needed and that he could follow up as outpatient. So Dr. Kerry Hough will plan on discharging this patient home today.

## 2020-10-26 NOTE — Progress Notes (Signed)
[facility] Cardiology  SUBJECTIVE: Patient sitting in chair, reports doing well, denies chest pain or shortness of breath   Vitals:   10/25/20 2315 10/26/20 0343 10/26/20 0605 10/26/20 0731  BP: 111/78 108/77  (!) 123/94  Pulse: 79 72  71  Resp: 18 15  19   Temp: 98 F (36.7 C) 97.7 F (36.5 C)  98.4 F (36.9 C)  TempSrc: Oral Oral    SpO2: 94% 97%  99%  Weight:   93.1 kg   Height:         Intake/Output Summary (Last 24 hours) at 10/26/2020 7846 Last data filed at 10/26/2020 0500 Gross per 24 hour  Intake 1838.58 ml  Output 800 ml  Net 1038.58 ml      PHYSICAL EXAM  General: Well developed, well nourished, in no acute distress HEENT:  Normocephalic and atramatic Neck:  No JVD.  Lungs: Clear bilaterally to auscultation and percussion. Heart: HRRR . Normal S1 and S2 without gallops or murmurs.  Abdomen: Bowel sounds are positive, abdomen soft and non-tender  Msk:  Back normal, normal gait. Normal strength and tone for age. Extremities: No clubbing, cyanosis or edema.   Neuro: Alert and oriented X 3. Psych:  Good affect, responds appropriately   LABS: Basic Metabolic Panel: Recent Labs    10/24/20 0450 10/24/20 0730 10/25/20 0455 10/26/20 0458  NA 137   < > 136 138  K 5.4*   < > 3.9 4.1  CL 109   < > 105 109  CO2 21*   < > 24 25  GLUCOSE 105*   < > 103* 97  BUN 19   < > 13 17  CREATININE 1.07   < > 0.99 1.02  CALCIUM 8.2*   < > 8.5* 8.5*  MG 2.2  --  2.0  --   PHOS 2.2*  --  2.1* 2.7   < > = values in this interval not displayed.   Liver Function Tests: Recent Labs    10/24/20 0730 10/25/20 0455  AST 346* 199*  ALT 345* 253*  ALKPHOS 45 40  BILITOT 0.6 0.8  PROT 6.3* 5.6*  ALBUMIN 3.7 3.2*   No results for input(s): LIPASE, AMYLASE in the last 72 hours. CBC: Recent Labs    10/25/20 0455 10/26/20 0458  WBC 17.1* 12.0*  NEUTROABS 11.2*  --   HGB 14.3 12.5*  HCT 40.0 35.0*  MCV 89.1 90.4  PLT 157 136*   Cardiac Enzymes: No results for  input(s): CKTOTAL, CKMB, CKMBINDEX, TROPONINI in the last 72 hours. BNP: Invalid input(s): POCBNP D-Dimer: No results for input(s): DDIMER in the last 72 hours. Hemoglobin A1C: Recent Labs    10/24/20 0730  HGBA1C 5.0   Fasting Lipid Panel: Recent Labs    10/24/20 0450  CHOL 176  HDL 27*  LDLCALC 118*  TRIG 148  156*  CHOLHDL 6.5   Thyroid Function Tests: No results for input(s): TSH, T4TOTAL, T3FREE, THYROIDAB in the last 72 hours.  Invalid input(s): FREET3 Anemia Panel: Recent Labs    10/25/20 1430  VITAMINB12 341    MR BRAIN WO CONTRAST  Result Date: 10/25/2020 CLINICAL DATA:  Loss of consciousness, evaluate for infarct EXAM: MRI HEAD WITHOUT CONTRAST TECHNIQUE: Multiplanar, multiecho pulse sequences of the brain and surrounding structures were obtained without intravenous contrast. COMPARISON:  None. FINDINGS: Brain: There is no evidence of acute intracranial hemorrhage, extra-axial fluid collection, or acute infarct. There is a single small focus of FLAIR signal abnormality in the right  corona radiata, nonspecific. There is no suspicious parenchymal signal abnormality. The ventricles are normal in size. The basal cisterns are patent. There is no mass lesion. There is no midline shift. Vascular: Normal flow voids. Skull and upper cervical spine: Normal marrow signal. Sinuses/Orbits: The imaged paranasal sinuses are clear. The globes and orbits are unremarkable. Other: None. IMPRESSION: Unremarkable brain MRI. No evidence of acute infarct or anoxic injury. Electronically Signed   By: Lesia Hausen M.D.   On: 10/25/2020 16:55   US Venous Img Lower Bilateral (DVT)  Result Date: 10/25/2020 CLINICAL DATA:  Ischemic rest pain of lower extremity. EXAM: BILATERAL LOWER EXTREMITY VENOUS DOPPLER ULTRASOUND TECHNIQUE: Gray-scale sonography with graded compression, as well as color Doppler and duplex ultrasound were performed to evaluate the lower extremity deep venous systems from the  level of the common femoral vein and including the common femoral, femoral, profunda femoral, popliteal and calf veins including the posterior tibial, peroneal and gastrocnemius veins when visible. The superficial great saphenous vein was also interrogated. Spectral Doppler was utilized to evaluate flow at rest and with distal augmentation maneuvers in the common femoral, femoral and popliteal veins. COMPARISON:  None. FINDINGS: RIGHT LOWER EXTREMITY Common Femoral Vein: No evidence of thrombus. Normal compressibility, respiratory phasicity and response to augmentation. Saphenofemoral Junction: No evidence of thrombus. Normal compressibility and flow on color Doppler imaging. Profunda Femoral Vein: No evidence of thrombus. Normal compressibility and flow on color Doppler imaging. Femoral Vein: No evidence of thrombus. Normal compressibility, respiratory phasicity and response to augmentation. Popliteal Vein: No evidence of thrombus. Normal compressibility, respiratory phasicity and response to augmentation. Calf Veins: No evidence of thrombus. Normal compressibility and flow on color Doppler imaging. Other Findings:  None. LEFT LOWER EXTREMITY Common Femoral Vein: No evidence of thrombus. Normal compressibility, respiratory phasicity and response to augmentation. Saphenofemoral Junction: No evidence of thrombus. Normal compressibility and flow on color Doppler imaging. Profunda Femoral Vein: No evidence of thrombus. Normal compressibility and flow on color Doppler imaging. Femoral Vein: No evidence of thrombus. Normal compressibility, respiratory phasicity and response to augmentation. Popliteal Vein: No evidence of thrombus. Normal compressibility, respiratory phasicity and response to augmentation. Calf Veins: No evidence of thrombus. Normal compressibility and flow on color Doppler imaging. Other Findings:  None. IMPRESSION: No evidence of deep venous thrombosis in either lower extremity. Electronically Signed    By: Richarda Overlie M.D.   On: 10/25/2020 08:55   DG Foot 2 Views Left  Result Date: 10/25/2020 CLINICAL DATA:  LEFT fifth fifth digit pain EXAM: LEFT FOOT - 2 VIEW COMPARISON:  None. FINDINGS: No fracture or dislocation of mid foot or forefoot. The phalanges are normal. The calcaneus is normal. No soft tissue abnormality. Remote screw fixation of the medial malleolus IMPRESSION: No acute osseous abnormality. Electronically Signed   By: Genevive Bi M.D.   On: 10/25/2020 17:05   ECHOCARDIOGRAM COMPLETE  Result Date: 10/26/2020    ECHOCARDIOGRAM REPORT   Patient Name:   [name] [facility] Date of Exam: 10/25/2020 Medical Rec #:  [id number]      Height:       67.0 in Accession #:    [id number]     Weight:       181.0 lb Date of Birth:  1985-03-28     BSA:          1.938 m Patient Age:    34 years       BP:           112/71 mmHg  Patient Gender: M              HR:           105 bpm. Exam Location:  [facility] Procedure: 2D Echo, Cardiac Doppler and Color Doppler Indications:     Acute myocardial infarction, unspecified I21.9  History:         Patient has no prior history of Echocardiogram examinations.                  Risk Factors:Hypertension and Current Smoker.  Sonographer:     Neysa Bonito Roar Referring Phys:  [id number] Lyn Hollingshead Jazline Cumbee Diagnosing Phys: Marcina Millard MD IMPRESSIONS  1. Left ventricular ejection fraction, by estimation, is 40 to 45%. The left ventricle has mildly decreased function. The left ventricle demonstrates regional wall motion abnormalities (see scoring diagram/findings for description). Left ventricular diastolic parameters were normal.  2. Right ventricular systolic function is normal. The right ventricular size is normal.  3. The mitral valve is normal in structure. Trivial mitral valve regurgitation. No evidence of mitral stenosis.  4. The aortic valve is normal in structure. Aortic valve regurgitation is not visualized. No aortic stenosis is present.  5. The inferior vena cava is normal  in size with greater than 50% respiratory variability, suggesting right atrial pressure of 3 mmHg. FINDINGS  Left Ventricle: Left ventricular ejection fraction, by estimation, is 40 to 45%. The left ventricle has mildly decreased function. The left ventricle demonstrates regional wall motion abnormalities. The left ventricular internal cavity size was normal in size. There is no left ventricular hypertrophy. Left ventricular diastolic parameters were normal.  LV Wall Scoring: The inferior wall is hypokinetic. Right Ventricle: The right ventricular size is normal. No increase in right ventricular wall thickness. Right ventricular systolic function is normal. Left Atrium: Left atrial size was normal in size. Right Atrium: Right atrial size was normal in size. Pericardium: There is no evidence of pericardial effusion. Mitral Valve: The mitral valve is normal in structure. Trivial mitral valve regurgitation. No evidence of mitral valve stenosis. Tricuspid Valve: The tricuspid valve is normal in structure. Tricuspid valve regurgitation is trivial. No evidence of tricuspid stenosis. Aortic Valve: The aortic valve is normal in structure. Aortic valve regurgitation is not visualized. No aortic stenosis is present. Aortic valve peak gradient measures 4.2 mmHg. Pulmonic Valve: The pulmonic valve was normal in structure. Pulmonic valve regurgitation is not visualized. No evidence of pulmonic stenosis. Aorta: The aortic root is normal in size and structure. Venous: The inferior vena cava is normal in size with greater than 50% respiratory variability, suggesting right atrial pressure of 3 mmHg. IAS/Shunts: No atrial level shunt detected by color flow Doppler.  LEFT VENTRICLE PLAX 2D LVIDd:         5.10 cm  Diastology LVIDs:         4.20 cm  LV e' medial:    7.40 cm/s LV PW:         1.00 cm  LV E/e' medial:  8.3 LV IVS:        0.90 cm  LV e' lateral:   9.57 cm/s LVOT diam:     2.10 cm  LV E/e' lateral: 6.4 LVOT Area:     3.46  cm  RIGHT VENTRICLE RV Basal diam:  3.60 cm RV Mid diam:    2.90 cm RV S prime:     10.60 cm/s LEFT ATRIUM             Index  RIGHT ATRIUM           Index LA diam:        3.40 cm 1.75 cm/m  RA Area:     11.00 cm LA Vol (A2C):   36.9 ml 19.04 ml/m RA Volume:   24.00 ml  12.38 ml/m LA Vol (A4C):   31.0 ml 15.99 ml/m LA Biplane Vol: 33.9 ml 17.49 ml/m  AORTIC VALVE                PULMONIC VALVE AV Area (Vmax): 2.52 cm    PV Vmax:          0.77 m/s AV Vmax:        102.00 cm/s PV Peak grad:     2.4 mmHg AV Peak Grad:   4.2 mmHg    PR End Diast Vel: 3.82 msec LVOT Vmax:      74.10 cm/s  RVOT Peak grad:   1 mmHg  AORTA Ao Root diam: 2.60 cm MITRAL VALVE               TRICUSPID VALVE MV Area (PHT): 2.68 cm    TR Peak grad:   15.5 mmHg MV Decel Time: 283 msec    TR Vmax:        197.00 cm/s MV E velocity: 61.30 cm/s MV A velocity: 48.00 cm/s  SHUNTS MV E/A ratio:  1.28        Systemic Diam: 2.10 cm MV A Prime:    8.4 cm/s Marcina Millard MD Electronically signed by Marcina Millard MD Signature Date/Time: 10/26/2020/8:45:14 AM    Final    US Abdomen Limited RUQ (LIVER/GB)  Result Date: 10/26/2020 CLINICAL DATA:  Increased LFTs EXAM: ULTRASOUND ABDOMEN LIMITED RIGHT UPPER QUADRANT COMPARISON:  None. FINDINGS: Gallbladder: Slight gallbladder wall thickening and trace pericholecystic fluid. Wall thickness measures up to 9 mm. Despite this, no sonographic Murphy's sign elicited. Negative for gallstones. Common bile duct: Diameter: 2.7 mm Liver: No focal lesion identified. Within normal limits in parenchymal echogenicity. Portal vein is patent on color Doppler imaging with normal direction of blood flow towards the liver. Other: No upper abdominal free fluid. IMPRESSION: Gallbladder wall thickening measuring up to 9 mm with trace pericholecystic fluid between the gallbladder and liver surface. Findings remain indeterminate. No Murphy's sign elicited. Negative for gallstones. No biliary dilatation or  obstruction pattern No focal hepatic abnormality. Electronically Signed   By: Judie Petit.  Shick M.D.   On: 10/26/2020 08:16     Echo LVEF 40 to 45%  TELEMETRY: Sinus rhythm:  ASSESSMENT AND PLAN:  Active Problems:   STEMI involving oth coronary artery of inferior wall (HCC)   Acute respiratory failure with hypoxia and hypercapnia (HCC)   Endotracheally intubated   On mechanically assisted ventilation (HCC)   Transaminitis   Shock (HCC)   Cardiac arrest (HCC)    1. Inferior ST elevation myocardial infarction, with occluded proximal RCA, status post successful primary PCI with DES proximal RCA, with mildly reduced left ventricular function, LVEF 40 to 45% 2.  Status post cardiac arrest, no residual ventricular rhythm he is 3.  Elevated LFTs, in the setting of STEMI 4.  Tobacco abuse 5.  EtOH use, patient reports drinking 12 beers on weekend days   Recommendations   1.  Dual antiplatelet therapy uninterrupted for 1 year 2.  Continue metoprolol to tartrate 25 mg twice daily 3.  Continue lisinopril to 5 mg daily 4.  Start high intensity atorvastatin 80 mg daily when LFTs improve,  as outpatient 6.  Advised patient to stop smoking 7.  Advised patient limit EtOH 8.  May discharge home 9.  Follow-up with me 1 to 2 weeks     Marcina Millard, MD, PhD, [facility] 10/26/2020 8:52 AM

## 2020-10-26 NOTE — Discharge Summary (Signed)
Physician Discharge Summary  [name] HKV:[id number] DOB: [birth year] DOA: 10/23/2020  PCP: Oneita Hurt, No  Admit date: 10/23/2020 Discharge date: 10/26/2020  Admitted From: Home Disposition:  home  Recommendations for Outpatient Follow-up:  Follow up with PCP in 1-2 weeks Please obtain BMP/CBC in one week Patient will follow up with cardiology in 1 to 2 weeks He has been referred to outpatient neurology for further work-up Referral to cardiac rehab has been made He will need referral to ophthalmology as an outpatient for blurry vision Factor V Leiden pending at the time of discharge  Discharge Condition: Stable CODE STATUS: Full code Diet recommendation: Heart healthy  Brief/Interim Summary: 35 year old male, was brought to the emergency room in private vehicle when he was noted to have chest pain.  His wife reports that he began passing out in the car.  On arrival to the emergency room, he was noted to be pulseless and CPR was initiated.  The patient was resuscitated.  He underwent cardiac catheterization and noted to have occluded RCA.  He underwent revascularization with stent placement.  Post procedure, the patient began to quickly improved.  He was able to come off the ventilator and is now awake and alert.  Discharge Diagnoses:  Active Problems:   STEMI involving oth coronary artery of inferior wall (HCC)   Acute respiratory failure with hypoxia and hypercapnia (HCC)   Endotracheally intubated   On mechanically assisted ventilation (HCC)   Transaminitis   Shock (HCC)   Cardiac arrest (HCC)  Inferior ST elevation MI -Patient underwent cardiac catheterization with successful primary PCI with DES to proximal RCA -Continue aspirin and Brilinta -Started on lisinopril and metoprolol -Can consider starting statin as an outpatient once LFTs have improved   Status post cardiac arrest -Likely related to STEMI -Review of record indicates that patient presented in asystole and CPR  was initiated.  He was subsequently noted to be in ventricular fibrillation and was shocked -CPR was continued for what appears to be approximately 25 minutes after which ROSC was achieved -Post resuscitation he was started on amiodarone, which was discontinued on 9/18   Acute respiratory failure with hypoxia -Related to cardiac arrest -Patient intubated and mechanically ventilated -He was extubated on 9/17 -Current respiratory status appears to be stable   Aspiration pneumonia -Had vomiting episode prior to intubation -Noted to have gastric contents and airway during intubation -Started on Unasyn, subsequently transition to Augmentin   Elevated liver enzymes -Suspect reactive to underlying hemodynamic instability during resuscitation -He does complain of right upper quadrant pain which becomes worse when he tries to eat -Right upper quadrant ultrasound did not show any biliary ductal dilatation or stones -Overall LFTs improving   Circulatory shock -Related to MI -Briefly required vasopressors including norepinephrine -Since then, hemodynamics have stabilized and he is off pressors now   New neurologic deficits -Reports decrease sensation in his right lower extremity as well as blurry vision -May have some sequelae from decreased cerebral perfusion during cardiac arrest -MRI brain found to be unremarkable -Discussed with Dr. Otelia Limes, neuro hospitalist who felt that further outpatient work-up would be appropriate   History of possible Raynaud's disease -Describes that he often has pain and discoloration of his fingers during cold weather -ANA checked and found to be negative -Reports that his father has a history of factor V Leiden mutation -This may have played a part in his underlying MI -Factor V Leiden currently in process, can be followed up by PCP.  May need outpatient referral  to hematology if positive.  Discharge Instructions  Discharge Instructions     AMB Referral  to Cardiac Rehabilitation - Phase II   Complete by: As directed    Diagnosis: STEMI   After initial evaluation and assessments completed: Virtual Based Care may be provided alone or in conjunction with Phase 2 Cardiac Rehab based on patient barriers.: Yes   Ambulatory referral to Neurology   Complete by: As directed    An appointment is requested in approximately: next available. Patient would be a new patient to practice. Needs outpatient evaluation for numbness in right lower leg      Allergies as of 10/26/2020   No Known Allergies      Medication List     TAKE these medications    amoxicillin-clavulanate 875-125 MG tablet Commonly known as: AUGMENTIN Take 1 tablet by mouth every 12 (twelve) hours.   aspirin 81 MG chewable tablet Chew 1 tablet (81 mg total) by mouth daily. Start taking on: October 27, 2020   lisinopril 5 MG tablet Commonly known as: ZESTRIL Take 1 tablet (5 mg total) by mouth daily. Start taking on: October 27, 2020   metoprolol tartrate 25 MG tablet Commonly known as: LOPRESSOR Take 1 tablet (25 mg total) by mouth 2 (two) times daily.   ticagrelor 90 MG Tabs tablet Commonly known as: BRILINTA Take 1 tablet (90 mg total) by mouth 2 (two) times daily.        Follow-up Information     Paraschos, Alexander, MD. Schedule an appointment as soon as possible for a visit in 2 week(s).   Specialty: Cardiology Contact information: [address] [facility] [place] [address] [phone]         [facility] will call you with an appointment for further neurologic work up Follow up.                 No Known Allergies  Consultations: Cardiology PCCM   Procedures/Studies: DG Abd 1 View  Result Date: 10/23/2020 CLINICAL DATA:  Orogastric tube placement. EXAM: ABDOMEN - 1 VIEW COMPARISON:  None. FINDINGS: Enteric tube noted with tip and side port overlying the expected region of the gastric lumen.  The bowel gas pattern is normal. Excreted intravenous contrast noted within the kidneys. No radio-opaque calculi or other significant radiographic abnormality are seen. IMPRESSION: Enteric tube in grossly appropriate position. Electronically Signed   By: Tish Frederickson M.D.   On: 10/23/2020 23:02   DG Abd 1 View  Result Date: 10/23/2020 CLINICAL DATA:  OG tube placement EXAM: ABDOMEN - 1 VIEW COMPARISON:  10/23/2020 FINDINGS: Esophageal tube tip overlies the GE junction region, side-port at the level of distal esophagus. Contrast within the renal collecting systems. IMPRESSION: 1. Esophageal tube tip at GE junction region, side-port in the region of distal esophagus, consider advancement by 10-15 cm for more optimal positioning. These results will be called to the ordering clinician or representative by the Radiologist Assistant, and communication documented in the PACS or [employer]. Electronically Signed   By: Jasmine Pang M.D.   On: 10/23/2020 21:39   MR BRAIN WO CONTRAST  Result Date: 10/25/2020 CLINICAL DATA:  Loss of consciousness, evaluate for infarct EXAM: MRI HEAD WITHOUT CONTRAST TECHNIQUE: Multiplanar, multiecho pulse sequences of the brain and surrounding structures were obtained without intravenous contrast. COMPARISON:  None. FINDINGS: Brain: There is no evidence of acute intracranial hemorrhage, extra-axial fluid collection, or acute infarct. There is a single small focus of FLAIR signal abnormality  in the right corona radiata, nonspecific. There is no suspicious parenchymal signal abnormality. The ventricles are normal in size. The basal cisterns are patent. There is no mass lesion. There is no midline shift. Vascular: Normal flow voids. Skull and upper cervical spine: Normal marrow signal. Sinuses/Orbits: The imaged paranasal sinuses are clear. The globes and orbits are unremarkable. Other: None. IMPRESSION: Unremarkable brain MRI. No evidence of acute infarct or anoxic injury.  Electronically Signed   By: Lesia Hausen M.D.   On: 10/25/2020 16:55   CARDIAC CATHETERIZATION  Result Date: 10/23/2020   Prox RCA-1 lesion is 100% stenosed.   Mid Cx to Dist Cx lesion is 30% stenosed.   Prox RCA-2 lesion is 50% stenosed.   RPDA lesion is 100% stenosed.   A drug-eluting stent was successfully placed using a STENT ONYX FRONTIER [id number].   Post intervention, there is a 0% residual stenosis.   LV end diastolic pressure is normal.   The left ventricular ejection fraction is 50-55% by visual estimate. 1.  Inferior ST elevation myocardial infarction 2.  Successful primary PCI with DES ostial/proximal RCA 3.  Preserved left ventricular function, with estimated LV ejection fraction 30% Recommendations 1.  Dual antiplatelet therapy uninterrupted for 1 year 2.  High intensity atorvastatin 3.  2D echocardiogram   US Venous Img Lower Bilateral (DVT)  Result Date: 10/25/2020 CLINICAL DATA:  Ischemic rest pain of lower extremity. EXAM: BILATERAL LOWER EXTREMITY VENOUS DOPPLER ULTRASOUND TECHNIQUE: Gray-scale sonography with graded compression, as well as color Doppler and duplex ultrasound were performed to evaluate the lower extremity deep venous systems from the level of the common femoral vein and including the common femoral, femoral, profunda femoral, popliteal and calf veins including the posterior tibial, peroneal and gastrocnemius veins when visible. The superficial great saphenous vein was also interrogated. Spectral Doppler was utilized to evaluate flow at rest and with distal augmentation maneuvers in the common femoral, femoral and popliteal veins. COMPARISON:  None. FINDINGS: RIGHT LOWER EXTREMITY Common Femoral Vein: No evidence of thrombus. Normal compressibility, respiratory phasicity and response to augmentation. Saphenofemoral Junction: No evidence of thrombus. Normal compressibility and flow on color Doppler imaging. Profunda Femoral Vein: No evidence of thrombus. Normal compressibility  and flow on color Doppler imaging. Femoral Vein: No evidence of thrombus. Normal compressibility, respiratory phasicity and response to augmentation. Popliteal Vein: No evidence of thrombus. Normal compressibility, respiratory phasicity and response to augmentation. Calf Veins: No evidence of thrombus. Normal compressibility and flow on color Doppler imaging. Other Findings:  None. LEFT LOWER EXTREMITY Common Femoral Vein: No evidence of thrombus. Normal compressibility, respiratory phasicity and response to augmentation. Saphenofemoral Junction: No evidence of thrombus. Normal compressibility and flow on color Doppler imaging. Profunda Femoral Vein: No evidence of thrombus. Normal compressibility and flow on color Doppler imaging. Femoral Vein: No evidence of thrombus. Normal compressibility, respiratory phasicity and response to augmentation. Popliteal Vein: No evidence of thrombus. Normal compressibility, respiratory phasicity and response to augmentation. Calf Veins: No evidence of thrombus. Normal compressibility and flow on color Doppler imaging. Other Findings:  None. IMPRESSION: No evidence of deep venous thrombosis in either lower extremity. Electronically Signed   By: Richarda Overlie M.D.   On: 10/25/2020 08:55   DG Chest Port 1 View  Result Date: 10/24/2020 CLINICAL DATA:  Endotracheal tube EXAM: PORTABLE CHEST 1 VIEW COMPARISON:  Chest radiograph 1 day prior FINDINGS: The endotracheal tube is in the lower thoracic trachea approximately 1.1 cm from the carina. The enteric catheter courses off the field  of view. Multiple additional leads and defibrillator pad overlie the chest. The cardiomediastinal silhouette is stable. Lung aeration is not significantly changed, with no new focal airspace opacity, pleural effusion, or pneumothorax. There is no acute osseous abnormality. IMPRESSION: 1. Endotracheal tube approximately 1.1 cm from the carina. 2. Unchanged lung aeration. Electronically Signed   By: Lesia Hausen M.D.   On: 10/24/2020 08:03   DG Chest Port 1 View  Result Date: 10/23/2020 CLINICAL DATA:  CPR; post CPR after found unresponsive EXAM: PORTABLE CHEST 1 VIEW COMPARISON:  2015 FINDINGS: Endotracheal tube in satisfactory position. Enteric tube tip is at the gastroesophageal junction. Lung volumes are low. No consolidation. Probable patchy atelectasis. No pleural effusion. No pneumothorax. IMPRESSION: Endotracheal tube in place. Enteric tube tip at gastroesophageal junction. Low lung volumes with probable patchy atelectasis. Electronically Signed   By: Guadlupe Spanish M.D.   On: 10/23/2020 18:36   DG Foot 2 Views Left  Result Date: 10/25/2020 CLINICAL DATA:  LEFT fifth fifth digit pain EXAM: LEFT FOOT - 2 VIEW COMPARISON:  None. FINDINGS: No fracture or dislocation of mid foot or forefoot. The phalanges are normal. The calcaneus is normal. No soft tissue abnormality. Remote screw fixation of the medial malleolus IMPRESSION: No acute osseous abnormality. Electronically Signed   By: Genevive Bi M.D.   On: 10/25/2020 17:05   ECHOCARDIOGRAM COMPLETE  Result Date: 10/26/2020    ECHOCARDIOGRAM REPORT   Patient Name:   [name] [facility] Date of Exam: 10/25/2020 Medical Rec #:  [id number]      Height:       67.0 in Accession #:    [id number]     Weight:       181.0 lb Date of Birth:  May 18, 1985     BSA:          1.938 m Patient Age:    34 years       BP:           112/71 mmHg Patient Gender: M              HR:           105 bpm. Exam Location:  [facility] Procedure: 2D Echo, Cardiac Doppler and Color Doppler Indications:     Acute myocardial infarction, unspecified I21.9  History:         Patient has no prior history of Echocardiogram examinations.                  Risk Factors:Hypertension and Current Smoker.  Sonographer:     Neysa Bonito Roar Referring Phys:  [id number] Lyn Hollingshead PARASCHOS Diagnosing Phys: Marcina Millard MD IMPRESSIONS  1. Left ventricular ejection fraction, by estimation, is 40 to 45%. The left  ventricle has mildly decreased function. The left ventricle demonstrates regional wall motion abnormalities (see scoring diagram/findings for description). Left ventricular diastolic parameters were normal.  2. Right ventricular systolic function is normal. The right ventricular size is normal.  3. The mitral valve is normal in structure. Trivial mitral valve regurgitation. No evidence of mitral stenosis.  4. The aortic valve is normal in structure. Aortic valve regurgitation is not visualized. No aortic stenosis is present.  5. The inferior vena cava is normal in size with greater than 50% respiratory variability, suggesting right atrial pressure of 3 mmHg. FINDINGS  Left Ventricle: Left ventricular ejection fraction, by estimation, is 40 to 45%. The left ventricle has mildly decreased function. The left ventricle demonstrates regional wall motion abnormalities. The left ventricular internal  cavity size was normal in size. There is no left ventricular hypertrophy. Left ventricular diastolic parameters were normal.  LV Wall Scoring: The inferior wall is hypokinetic. Right Ventricle: The right ventricular size is normal. No increase in right ventricular wall thickness. Right ventricular systolic function is normal. Left Atrium: Left atrial size was normal in size. Right Atrium: Right atrial size was normal in size. Pericardium: There is no evidence of pericardial effusion. Mitral Valve: The mitral valve is normal in structure. Trivial mitral valve regurgitation. No evidence of mitral valve stenosis. Tricuspid Valve: The tricuspid valve is normal in structure. Tricuspid valve regurgitation is trivial. No evidence of tricuspid stenosis. Aortic Valve: The aortic valve is normal in structure. Aortic valve regurgitation is not visualized. No aortic stenosis is present. Aortic valve peak gradient measures 4.2 mmHg. Pulmonic Valve: The pulmonic valve was normal in structure. Pulmonic valve regurgitation is not visualized.  No evidence of pulmonic stenosis. Aorta: The aortic root is normal in size and structure. Venous: The inferior vena cava is normal in size with greater than 50% respiratory variability, suggesting right atrial pressure of 3 mmHg. IAS/Shunts: No atrial level shunt detected by color flow Doppler.  LEFT VENTRICLE PLAX 2D LVIDd:         5.10 cm  Diastology LVIDs:         4.20 cm  LV e' medial:    7.40 cm/s LV PW:         1.00 cm  LV E/e' medial:  8.3 LV IVS:        0.90 cm  LV e' lateral:   9.57 cm/s LVOT diam:     2.10 cm  LV E/e' lateral: 6.4 LVOT Area:     3.46 cm  RIGHT VENTRICLE RV Basal diam:  3.60 cm RV Mid diam:    2.90 cm RV S prime:     10.60 cm/s LEFT ATRIUM             Index       RIGHT ATRIUM           Index LA diam:        3.40 cm 1.75 cm/m  RA Area:     11.00 cm LA Vol (A2C):   36.9 ml 19.04 ml/m RA Volume:   24.00 ml  12.38 ml/m LA Vol (A4C):   31.0 ml 15.99 ml/m LA Biplane Vol: 33.9 ml 17.49 ml/m  AORTIC VALVE                PULMONIC VALVE AV Area (Vmax): 2.52 cm    PV Vmax:          0.77 m/s AV Vmax:        102.00 cm/s PV Peak grad:     2.4 mmHg AV Peak Grad:   4.2 mmHg    PR End Diast Vel: 3.82 msec LVOT Vmax:      74.10 cm/s  RVOT Peak grad:   1 mmHg  AORTA Ao Root diam: 2.60 cm MITRAL VALVE               TRICUSPID VALVE MV Area (PHT): 2.68 cm    TR Peak grad:   15.5 mmHg MV Decel Time: 283 msec    TR Vmax:        197.00 cm/s MV E velocity: 61.30 cm/s MV A velocity: 48.00 cm/s  SHUNTS MV E/A ratio:  1.28        Systemic Diam: 2.10 cm MV A Prime:  8.4 cm/s Marcina Millard MD Electronically signed by Marcina Millard MD Signature Date/Time: 10/26/2020/8:45:14 AM    Final    US Abdomen Limited RUQ (LIVER/GB)  Result Date: 10/26/2020 CLINICAL DATA:  Increased LFTs EXAM: ULTRASOUND ABDOMEN LIMITED RIGHT UPPER QUADRANT COMPARISON:  None. FINDINGS: Gallbladder: Slight gallbladder wall thickening and trace pericholecystic fluid. Wall thickness measures up to 9 mm. Despite this, no  sonographic Murphy's sign elicited. Negative for gallstones. Common bile duct: Diameter: 2.7 mm Liver: No focal lesion identified. Within normal limits in parenchymal echogenicity. Portal vein is patent on color Doppler imaging with normal direction of blood flow towards the liver. Other: No upper abdominal free fluid. IMPRESSION: Gallbladder wall thickening measuring up to 9 mm with trace pericholecystic fluid between the gallbladder and liver surface. Findings remain indeterminate. No Murphy's sign elicited. Negative for gallstones. No biliary dilatation or obstruction pattern No focal hepatic abnormality. Electronically Signed   By: Judie Petit.  Shick M.D.   On: 10/26/2020 08:16      Subjective: He does complain of persistent numbness in his right leg.  Still has some blurry vision.  P.o. intake is improving.  He feels ready for discharge home.  Discharge Exam: Vitals:   10/26/20 0800 10/26/20 0900 10/26/20 1530 10/26/20 2039  BP:   116/88 (!) 124/91  Pulse:   66 73  Resp:   20 16  Temp:   98.6 F (37 C) 97.9 F (36.6 C)  TempSrc:   Oral Oral  SpO2: 98% 98% 98% 100%  Weight: 93.1 kg     Height: 5\' 10"  (1.778 m)       General: Pt is alert, awake, not in acute distress Cardiovascular: RRR, S1/S2 +, no rubs, no gallops Respiratory: CTA bilaterally, no wheezing, no rhonchi Abdominal: Soft, NT, ND, bowel sounds + Extremities: no edema, no cyanosis    The results of significant diagnostics from this hospitalization (including imaging, microbiology, ancillary and laboratory) are listed below for reference.     Microbiology: Recent Results (from the past 240 hour(s))  MRSA Next Gen by PCR, Nasal     Status: None   Collection Time: 10/23/20  8:35 PM   Specimen: Nasal Mucosa; Nasal Swab  Result Value Ref Range Status   MRSA by PCR Next Gen NOT DETECTED NOT DETECTED Final    Comment: (NOTE) The GeneXpert MRSA Assay (FDA approved for NASAL specimens only), is one component of a comprehensive  MRSA colonization surveillance program. It is not intended to diagnose MRSA infection nor to guide or monitor treatment for MRSA infections. Test performance is not FDA approved in patients less than 51 years old. Performed at [facility], [address] [place], [place]   Resp Panel by RT-PCR (Flu A&B, Covid) Nasopharyngeal Swab     Status: None   Collection Time: 10/23/20  8:35 PM   Specimen: Nasopharyngeal Swab; Nasopharyngeal(NP) swabs in vial transport medium  Result Value Ref Range Status   SARS Coronavirus 2 by RT PCR NEGATIVE NEGATIVE Final    Comment: (NOTE) SARS-CoV-2 target nucleic acids are NOT DETECTED.  The SARS-CoV-2 RNA is generally detectable in upper respiratory specimens during the acute phase of infection. The lowest concentration of SARS-CoV-2 viral copies this assay can detect is 138 copies/mL. A negative result does not preclude SARS-Cov-2 infection and should not be used as the sole basis for treatment or other patient management decisions. A negative result may occur with  improper specimen collection/handling, submission of specimen other than nasopharyngeal swab, presence of  viral mutation(s) within the areas targeted by this assay, and inadequate number of viral copies(<138 copies/mL). A negative result must be combined with clinical observations, patient history, and epidemiological information. The expected result is Negative.  Fact Sheet for Patients:  BloggerCourse.com  Fact Sheet for Healthcare Providers:  SeriousBroker.it  This test is no t yet approved or cleared by the [place] FDA and  has been authorized for detection and/or diagnosis of SARS-CoV-2 by FDA under an Emergency Use Authorization (EUA). This EUA will remain  in effect (meaning this test can be used) for the duration of the COVID-19 declaration under Section 564(b)(1) of the Act, 21 U.S.C.section  360bbb-3(b)(1), unless the authorization is terminated  or revoked sooner.       Influenza A by PCR NEGATIVE NEGATIVE Final   Influenza B by PCR NEGATIVE NEGATIVE Final    Comment: (NOTE) The Xpert Xpress SARS-CoV-2/FLU/RSV plus assay is intended as an aid in the diagnosis of influenza from Nasopharyngeal swab specimens and should not be used as a sole basis for treatment. Nasal washings and aspirates are unacceptable for Xpert Xpress SARS-CoV-2/FLU/RSV testing.  Fact Sheet for Patients: BloggerCourse.com  Fact Sheet for Healthcare Providers: SeriousBroker.it  This test is not yet approved or cleared by the [place] FDA and has been authorized for detection and/or diagnosis of SARS-CoV-2 by FDA under an Emergency Use Authorization (EUA). This EUA will remain in effect (meaning this test can be used) for the duration of the COVID-19 declaration under Section 564(b)(1) of the Act, 21 U.S.C. section 360bbb-3(b)(1), unless the authorization is terminated or revoked.  Performed at [facility], [address] [place], [place] [address]   Culture, Respiratory w Gram Stain     Status: None   Collection Time: 10/24/20  3:36 AM   Specimen: Tracheal Aspirate; Respiratory  Result Value Ref Range Status   Specimen Description   Final    TRACHEAL ASPIRATE Performed at [facility], [address] [place], [place] [address]    Special Requests   Final    NONE Performed at [facility], [address] [place], [place] [address]    Gram Stain   Final    MODERATE WBC PRESENT,BOTH PMN AND MONONUCLEAR ABUNDANT GRAM POSITIVE COCCI IN CLUSTERS FEW GRAM POSITIVE COCCI IN PAIRS AND CHAINS FEW GRAM VARIABLE ROD    Culture   Final    ABUNDANT Normal respiratory flora-no Staph aureus or Pseudomonas seen Performed at [facility], [address] [place], [place] [address]    Report Status  10/26/2020 FINAL  Final     Labs: BNP (last 3 results) Recent Labs    10/23/20 1734  BNP 9.4   Basic Metabolic Panel: Recent Labs  Lab 10/23/20 1734 10/23/20 2158 10/24/20 0450 10/24/20 0730 10/25/20 0455 10/26/20 0458  NA 148*  --  137 137 136 138  K <2.0* 3.5 5.4* 4.9 3.9 4.1  CL 121*  --  109 107 105 109  CO2 16*  --  21* 21* 24 25  GLUCOSE 126*  --  105* 104* 103* 97  BUN 9  --  19 18 13 17   CREATININE 0.65  --  1.07 1.06 0.99 1.02  CALCIUM 5.1*  --  8.2* 8.0* 8.5* 8.5*  MG  --  2.4 2.2  --  2.0  --   PHOS  --  4.4 2.2*  --  2.1* 2.7   Liver Function Tests: Recent Labs  Lab 10/23/20 1734 10/24/20  3536 10/24/20 0730 10/25/20 0455 10/26/20 0458  AST 75* 395* 346* 199* 117*  ALT 111* 337* 345* 253* 171*  ALKPHOS 24* 45 45 40 35*  BILITOT 0.4 0.7 0.6 0.8 0.9  PROT 3.5* 6.3* 6.3* 5.6* 5.8*  ALBUMIN 2.0* 3.5 3.7 3.2* 3.3*   No results for input(s): LIPASE, AMYLASE in the last 168 hours. No results for input(s): AMMONIA in the last 168 hours. CBC: Recent Labs  Lab 10/23/20 1733 10/24/20 0450 10/25/20 0455 10/26/20 0458  WBC 6.3 18.6* 17.1* 12.0*  NEUTROABS  --   --  11.2*  --   HGB 9.4* 15.1 14.3 12.5*  HCT 27.6* 41.7 40.0 35.0*  MCV 95.2 89.3 89.1 90.4  PLT 78* 187 157 136*   Cardiac Enzymes: No results for input(s): CKTOTAL, CKMB, CKMBINDEX, TROPONINI in the last 168 hours. BNP: Invalid input(s): POCBNP CBG: Recent Labs  Lab 10/23/20 2318 10/24/20 0315 10/24/20 0735 10/24/20 1100 10/24/20 1623  GLUCAP 145* 105* 98 119* 110*   D-Dimer No results for input(s): DDIMER in the last 72 hours. Hgb A1c Recent Labs    10/23/20 2158 10/24/20 0730  HGBA1C 5.4 5.0   Lipid Profile Recent Labs    10/24/20 0450  CHOL 176  HDL 27*  LDLCALC 118*  TRIG 148  156*  CHOLHDL 6.5   Thyroid function studies No results for input(s): TSH, T4TOTAL, T3FREE, THYROIDAB in the last 72 hours.  Invalid input(s): FREET3 Anemia work up Recent Labs     10/25/20 1430  VITAMINB12 341   Urinalysis    Component Value Date/Time   COLORURINE YELLOW (A) 10/23/2020 1745   APPEARANCEUR CLOUDY (A) 10/23/2020 1745   LABSPEC 1.020 10/23/2020 1745   PHURINE 5.0 10/23/2020 1745   GLUCOSEU NEGATIVE 10/23/2020 1745   HGBUR SMALL (A) 10/23/2020 1745   BILIRUBINUR NEGATIVE 10/23/2020 1745   KETONESUR NEGATIVE 10/23/2020 1745   PROTEINUR NEGATIVE 10/23/2020 1745   NITRITE NEGATIVE 10/23/2020 1745   LEUKOCYTESUR NEGATIVE 10/23/2020 1745   Sepsis Labs Invalid input(s): PROCALCITONIN,  WBC,  LACTICIDVEN Microbiology Recent Results (from the past 240 hour(s))  MRSA Next Gen by PCR, Nasal     Status: None   Collection Time: 10/23/20  8:35 PM   Specimen: Nasal Mucosa; Nasal Swab  Result Value Ref Range Status   MRSA by PCR Next Gen NOT DETECTED NOT DETECTED Final    Comment: (NOTE) The GeneXpert MRSA Assay (FDA approved for NASAL specimens only), is one component of a comprehensive MRSA colonization surveillance program. It is not intended to diagnose MRSA infection nor to guide or monitor treatment for MRSA infections. Test performance is not FDA approved in patients less than 66 years old. Performed at [facility], [address] [place], [place] [address]   Resp Panel by RT-PCR (Flu A&B, Covid) Nasopharyngeal Swab     Status: None   Collection Time: 10/23/20  8:35 PM   Specimen: Nasopharyngeal Swab; Nasopharyngeal(NP) swabs in vial transport medium  Result Value Ref Range Status   SARS Coronavirus 2 by RT PCR NEGATIVE NEGATIVE Final    Comment: (NOTE) SARS-CoV-2 target nucleic acids are NOT DETECTED.  The SARS-CoV-2 RNA is generally detectable in upper respiratory specimens during the acute phase of infection. The lowest concentration of SARS-CoV-2 viral copies this assay can detect is 138 copies/mL. A negative result does not preclude SARS-Cov-2 infection and should not be used as the sole basis for treatment or other  patient management decisions. A negative result may occur with  improper specimen collection/handling, submission of specimen other than nasopharyngeal swab, presence of viral mutation(s) within the areas targeted by this assay, and inadequate number of viral copies(<138 copies/mL). A negative result must be combined with clinical observations, patient history, and epidemiological information. The expected result is Negative.  Fact Sheet for Patients:  BloggerCourse.com  Fact Sheet for Healthcare Providers:  SeriousBroker.it  This test is no t yet approved or cleared by the [place] FDA and  has been authorized for detection and/or diagnosis of SARS-CoV-2 by FDA under an Emergency Use Authorization (EUA). This EUA will remain  in effect (meaning this test can be used) for the duration of the COVID-19 declaration under Section 564(b)(1) of the Act, 21 U.S.C.section 360bbb-3(b)(1), unless the authorization is terminated  or revoked sooner.       Influenza A by PCR NEGATIVE NEGATIVE Final   Influenza B by PCR NEGATIVE NEGATIVE Final    Comment: (NOTE) The Xpert Xpress SARS-CoV-2/FLU/RSV plus assay is intended as an aid in the diagnosis of influenza from Nasopharyngeal swab specimens and should not be used as a sole basis for treatment. Nasal washings and aspirates are unacceptable for Xpert Xpress SARS-CoV-2/FLU/RSV testing.  Fact Sheet for Patients: BloggerCourse.com  Fact Sheet for Healthcare Providers: SeriousBroker.it  This test is not yet approved or cleared by the [place] FDA and has been authorized for detection and/or diagnosis of SARS-CoV-2 by FDA under an Emergency Use Authorization (EUA). This EUA will remain in effect (meaning this test can be used) for the duration of the COVID-19 declaration under Section 564(b)(1) of the Act, 21 U.S.C. section  360bbb-3(b)(1), unless the authorization is terminated or revoked.  Performed at [facility], [address] [place], [place] [address]   Culture, Respiratory w Gram Stain     Status: None   Collection Time: 10/24/20  3:36 AM   Specimen: Tracheal Aspirate; Respiratory  Result Value Ref Range Status   Specimen Description   Final    TRACHEAL ASPIRATE Performed at [facility], [address] [place], [place] [address]    Special Requests   Final    NONE Performed at [facility], [address] [place], [place] [address]    Gram Stain   Final    MODERATE WBC PRESENT,BOTH PMN AND MONONUCLEAR ABUNDANT GRAM POSITIVE COCCI IN CLUSTERS FEW GRAM POSITIVE COCCI IN PAIRS AND CHAINS FEW GRAM VARIABLE ROD    Culture   Final    ABUNDANT Normal respiratory flora-no Staph aureus or Pseudomonas seen Performed at [facility], [address] [place], [place] [address]    Report Status 10/26/2020 FINAL  Final     Time coordinating discharge:  SIGNED:   Erick Blinks, MD  [facility] 10/26/2020, 9:22 PM   If 7PM-7AM, please contact night-coverage [email or web address]

## 2020-10-27 DIAGNOSIS — J9602 Acute respiratory failure with hypercapnia: Secondary | ICD-10-CM

## 2020-10-27 LAB — PHOSPHORUS: Phosphorus: 3.6 mg/dL (ref 2.5–4.6)

## 2020-10-27 LAB — BASIC METABOLIC PANEL
Anion gap: 8 (ref 5–15)
BUN: 17 mg/dL (ref 6–20)
CO2: 25 mmol/L (ref 22–32)
Calcium: 8.9 mg/dL (ref 8.9–10.3)
Chloride: 105 mmol/L (ref 98–111)
Creatinine, Ser: 0.93 mg/dL (ref 0.61–1.24)
GFR, Estimated: >60 mL/min (ref >60)
Glucose, Bld: 98 mg/dL (ref 70–99)
Potassium: 3.6 mmol/L (ref 3.5–5.1)
Sodium: 138 mmol/L (ref 135–145)

## 2020-10-27 LAB — MAGNESIUM: Magnesium: 2.3 mg/dL (ref 1.7–2.4)

## 2020-10-27 MED ORDER — AMOXICILLIN-POT CLAVULANATE 875-125 MG PO TABS
1.0000 | ORAL_TABLET | Freq: Two times a day (BID) | ORAL | Status: AC
  Administered 2020-10-27: 1 via ORAL

## 2020-10-27 MED FILL — Tenecteplase For IV Soln Kit 50 MG: INTRAVENOUS | Qty: 8 | Status: AC

## 2020-10-27 MED FILL — Tenecteplase For IV Soln Kit 50 MG: INTRAVENOUS | Qty: 2 | Status: AC

## 2020-10-27 NOTE — Consult Note (Signed)
   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  He presented to the emergency room by personal vehicle was found to be unresponsive.  He was noted to be in cardiac arrest and was taken to the cardiac catheterization lab where a DES was placed to the proximal stent.  Comorbidities:  Patient's father has history of factor V Leiden   Labs:  Sodium 138, potassium 3.6, chloride 105, CO2 25, BUN 17, creatinine 0.93, magnesium 2.3, phosphorus 3.6. Weight is 93 kg Intake 1172 mL Output 1400 mL  Medication:  Aspirin 81 mg daily Lisinopril 2.5 mg daily Metoprolol tartrate 25 mg 2 times a day Brilinta 90 mg 2 times a day  Initial meeting with patient and wife at bedside today.  Discussed the decreased function of his heart.  Also talked about the importance of low-sodium diet, weighing daily and what to report to physician.  Wife states that they do have a scale at home and that he would weigh daily and record.  Wife states that she has been trying to get a hold of the [employer] and all she gets is a recording that takes her no where.  In looking at the [facility] card it states that the top that they present this card along with the prescription to the pharmacy.  Nursing made aware of that.  Have also talked with the pharmacist at medication management and as long as patient was a patient and being discharged from [facility] he would qualify for medications from medical management and as long as he follows with: Provider post discharge he would continue to be qualified for medication management.  But prior to that he would have to meet with qualifying coordinator.  Also secure chatted with Dr. Gwendel Hanson to see if patient qualified for a life vest post STEMI.  Also discussed that he would have a cardiograms performed here in the upcoming months to continue to follow what his ejection fraction is.  He has an appointment to follow-up in the heart failure clinic on  September 28 at 4 PM in the afternoon.  They were given information about the outpatient heart failure clinic along with living with heart failure booklet.   Tresa Endo RN CHFN

## 2020-10-27 NOTE — TOC Progression Note (Signed)
Transition of Care [facility]) - Progression Note    Patient Details  Name: [name] MRN: [id number] Date of Birth: 20-Nov-1985  [facility]) CM/SW Contact  Maree Krabbe, LCSW Phone Number: 10/27/2020, 3:07 PM  Clinical Narrative:   CSW provided pt info to [facility]. Application printed on floor for RN to put with dc ppwk.         Expected Discharge Plan and Services           Expected Discharge Date: 10/27/20                                     Social Determinants of Health (SDOH) Interventions    Readmission Risk Interventions No flowsheet data found.

## 2020-10-27 NOTE — Consult Note (Signed)
PHARMACY CONSULT NOTE - FOLLOW UP  Pharmacy Consult for Electrolyte Monitoring and Replacement   Recent Labs: Potassium (mmol/L)  Date Value  10/27/2020 3.6   Magnesium (mg/dL)  Date Value  13/14/3888 2.3   Calcium (mg/dL)  Date Value  75/79/7282 8.9   Albumin (g/dL)  Date Value  07/08/5613 3.3 (L)   Phosphorus (mg/dL)  Date Value  37/94/3276 3.6   Sodium (mmol/L)  Date Value  10/27/2020 138   Assessment: 35 year old male presenting to [facility] ED on 10/23/2020 unresponsive in personal vehicle. Patient experienced cardiac arrest and is s/p CPR and intubation. Pt was extubated 9/17. In the ED pt underwent multiple rounds of ACLS including defibrillation and required levophed drip.  S/p PCI with DES promximal RCA on 9/17 for STEMI.  Goal of Therapy:  WNL  Plan:  No replacement indicated today F/u with AM labs.   [name], PharmD Pharmacy Resident  10/27/2020 1:05 PM

## 2020-10-27 NOTE — Progress Notes (Signed)
Patient has been briefed by Pharmacist about options for obtaining their medication. Patient will have their medication dispensed at the hospital before discharge.  Social Worker will follow up with them about finding a PCP.  Physician has put in discharge orders for today.

## 2020-10-27 NOTE — Progress Notes (Signed)
Patient seen and examined.  He was stable for discharge yesterday, but ended up staying in the hospital to ensure that he could receive his medications prior to discharge  He does not have any new complaints.  Feels unchanged as compared to yesterday  Labs reviewed from this morning and were unremarkable  TOC/pharmacy working on medication assistance to assure that he can receive his cardiac meds on discharge  Please refer to discharge summary done yesterday regarding further hospital course.  Discharge plan/medications unchanged from yesterday.  He is still stable for discharge.  [employer]

## 2020-10-28 ENCOUNTER — Encounter: Payer: Self-pay | Admitting: Neurology

## 2020-10-29 LAB — FACTOR 5 LEIDEN

## 2020-11-04 ENCOUNTER — Other Ambulatory Visit: Payer: Self-pay

## 2020-11-04 ENCOUNTER — Encounter: Payer: Self-pay | Admitting: Family

## 2020-11-04 ENCOUNTER — Ambulatory Visit: Payer: Medicaid Other | Attending: Family | Admitting: Family

## 2020-11-04 VITALS — BP 99/72 | HR 64 | Resp 20 | Ht 70.0 in | Wt 198.2 lb

## 2020-11-04 DIAGNOSIS — I5042 Chronic combined systolic (congestive) and diastolic (congestive) heart failure: Secondary | ICD-10-CM

## 2020-11-04 DIAGNOSIS — Z7901 Long term (current) use of anticoagulants: Secondary | ICD-10-CM | POA: Insufficient documentation

## 2020-11-04 DIAGNOSIS — I5032 Chronic diastolic (congestive) heart failure: Secondary | ICD-10-CM | POA: Diagnosis not present

## 2020-11-04 DIAGNOSIS — Z7902 Long term (current) use of antithrombotics/antiplatelets: Secondary | ICD-10-CM | POA: Diagnosis not present

## 2020-11-04 DIAGNOSIS — Z79899 Other long term (current) drug therapy: Secondary | ICD-10-CM | POA: Insufficient documentation

## 2020-11-04 DIAGNOSIS — I2119 ST elevation (STEMI) myocardial infarction involving other coronary artery of inferior wall: Secondary | ICD-10-CM

## 2020-11-04 DIAGNOSIS — Z87891 Personal history of nicotine dependence: Secondary | ICD-10-CM | POA: Insufficient documentation

## 2020-11-04 DIAGNOSIS — R002 Palpitations: Secondary | ICD-10-CM | POA: Insufficient documentation

## 2020-11-04 DIAGNOSIS — R42 Dizziness and giddiness: Secondary | ICD-10-CM | POA: Diagnosis present

## 2020-11-04 DIAGNOSIS — Z8249 Family history of ischemic heart disease and other diseases of the circulatory system: Secondary | ICD-10-CM | POA: Insufficient documentation

## 2020-11-04 DIAGNOSIS — I110 Hypertensive heart disease with heart failure: Secondary | ICD-10-CM | POA: Insufficient documentation

## 2020-11-04 DIAGNOSIS — Z955 Presence of coronary angioplasty implant and graft: Secondary | ICD-10-CM | POA: Diagnosis not present

## 2020-11-04 DIAGNOSIS — I2510 Atherosclerotic heart disease of native coronary artery without angina pectoris: Secondary | ICD-10-CM | POA: Diagnosis not present

## 2020-11-04 DIAGNOSIS — E7800 Pure hypercholesterolemia, unspecified: Secondary | ICD-10-CM | POA: Insufficient documentation

## 2020-11-04 DIAGNOSIS — Z7982 Long term (current) use of aspirin: Secondary | ICD-10-CM | POA: Diagnosis not present

## 2020-11-04 DIAGNOSIS — Z720 Tobacco use: Secondary | ICD-10-CM
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
## 2020-11-04 NOTE — Patient Instructions (Signed)
Continue weighing daily and call for an overnight weight gain of > 2 pounds or a weekly weight gain of >5 pounds. 

## 2020-11-05 ENCOUNTER — Encounter: Payer: Self-pay | Admitting: Family

## 2020-11-10 ENCOUNTER — Telehealth: Payer: Self-pay | Admitting: Gerontology

## 2020-11-10 NOTE — Telephone Encounter (Signed)
-----   Message from Elberta Fortis sent at 11/04/2020 10:24 AM EDT ----- Please call patient ----- Message ----- From: Maree Krabbe, LCSW Sent: 10/27/2020   3:06 PM EDT To: Tammi Sou Hedgebeth  Please follow up with pt, needs PCP.  Active Problems:   STEMI involving oth coronary artery of inferior wall (HCC)   Acute respiratory failure with hypoxia and hypercapnia (HCC)   Endotracheally intubated   On mechanically assisted ventilation (HCC)   Transaminitis   Shock (HCC)   Cardiac arrest (HCC)  Application provided at bedside.

## 2020-11-12 ENCOUNTER — Other Ambulatory Visit: Payer: Self-pay

## 2020-11-12 ENCOUNTER — Encounter
Admission: EM | Disposition: A | Discharge status: Home / Self Care | Payer: Self-pay | Source: Home / Self Care | Attending: Internal Medicine

## 2020-11-12 ENCOUNTER — Observation Stay: Payer: Medicaid Other

## 2020-11-12 ENCOUNTER — Emergency Department: Payer: Medicaid Other

## 2020-11-12 ENCOUNTER — Encounter: Payer: Self-pay | Admitting: Emergency Medicine

## 2020-11-12 ENCOUNTER — Inpatient Hospital Stay
Admission: EM | Admit: 2020-11-12 | Discharge: 2020-11-18 | DRG: 163 | Disposition: A | Discharge status: Home / Self Care | Payer: Medicaid Other | Attending: Internal Medicine | Admitting: Internal Medicine

## 2020-11-12 ENCOUNTER — Other Ambulatory Visit (INDEPENDENT_AMBULATORY_CARE_PROVIDER_SITE_OTHER): Payer: Self-pay | Admitting: Vascular Surgery

## 2020-11-12 DIAGNOSIS — D72829 Elevated white blood cell count, unspecified: Secondary | ICD-10-CM | POA: Diagnosis not present

## 2020-11-12 DIAGNOSIS — Z28310 Unvaccinated for covid-19: Secondary | ICD-10-CM

## 2020-11-12 DIAGNOSIS — H538 Other visual disturbances: Secondary | ICD-10-CM | POA: Diagnosis present

## 2020-11-12 DIAGNOSIS — Z7902 Long term (current) use of antithrombotics/antiplatelets: Secondary | ICD-10-CM

## 2020-11-12 DIAGNOSIS — Z87891 Personal history of nicotine dependence: Secondary | ICD-10-CM

## 2020-11-12 DIAGNOSIS — I2694 Multiple subsegmental pulmonary emboli without acute cor pulmonale: Principal | ICD-10-CM | POA: Diagnosis present

## 2020-11-12 DIAGNOSIS — I509 Heart failure, unspecified: Secondary | ICD-10-CM

## 2020-11-12 DIAGNOSIS — E785 Hyperlipidemia, unspecified: Secondary | ICD-10-CM

## 2020-11-12 DIAGNOSIS — R7401 Elevation of levels of liver transaminase levels: Secondary | ICD-10-CM | POA: Diagnosis present

## 2020-11-12 DIAGNOSIS — I2699 Other pulmonary embolism without acute cor pulmonale: Secondary | ICD-10-CM | POA: Diagnosis present

## 2020-11-12 DIAGNOSIS — I255 Ischemic cardiomyopathy: Secondary | ICD-10-CM

## 2020-11-12 DIAGNOSIS — Z7982 Long term (current) use of aspirin: Secondary | ICD-10-CM

## 2020-11-12 DIAGNOSIS — U071 COVID-19: Secondary | ICD-10-CM

## 2020-11-12 DIAGNOSIS — Z9861 Coronary angioplasty status: Secondary | ICD-10-CM

## 2020-11-12 DIAGNOSIS — G5791 Unspecified mononeuropathy of right lower limb: Secondary | ICD-10-CM | POA: Diagnosis present

## 2020-11-12 DIAGNOSIS — J9601 Acute respiratory failure with hypoxia: Secondary | ICD-10-CM | POA: Diagnosis present

## 2020-11-12 DIAGNOSIS — Z8249 Family history of ischemic heart disease and other diseases of the circulatory system: Secondary | ICD-10-CM

## 2020-11-12 DIAGNOSIS — I2119 ST elevation (STEMI) myocardial infarction involving other coronary artery of inferior wall: Secondary | ICD-10-CM | POA: Diagnosis present

## 2020-11-12 DIAGNOSIS — J9602 Acute respiratory failure with hypercapnia: Secondary | ICD-10-CM | POA: Diagnosis present

## 2020-11-12 DIAGNOSIS — I252 Old myocardial infarction: Secondary | ICD-10-CM

## 2020-11-12 DIAGNOSIS — I10 Essential (primary) hypertension: Secondary | ICD-10-CM

## 2020-11-12 DIAGNOSIS — I2510 Atherosclerotic heart disease of native coronary artery without angina pectoris: Secondary | ICD-10-CM | POA: Diagnosis present

## 2020-11-12 DIAGNOSIS — Z79899 Other long term (current) drug therapy: Secondary | ICD-10-CM

## 2020-11-12 HISTORY — PX: PULMONARY THROMBECTOMY: CATH118295

## 2020-11-12 LAB — CBC
HCT: 39.3 % (ref 39.0–52.0)
Hemoglobin: 14 g/dL (ref 13.0–17.0)
MCH: 31.5 pg (ref 26.0–34.0)
MCHC: 35.6 g/dL (ref 30.0–36.0)
MCV: 88.5 fL (ref 80.0–100.0)
Platelets: 266 10*3/uL (ref 150–400)
RBC: 4.44 MIL/uL (ref 4.22–5.81)
RDW: 13.2 % (ref 11.5–15.5)
WBC: 12.2 10*3/uL — ABNORMAL HIGH (ref 4.0–10.5)
nRBC: 0 % (ref 0.0–0.2)

## 2020-11-12 LAB — BASIC METABOLIC PANEL
Anion gap: 8 (ref 5–15)
BUN: 11 mg/dL (ref 6–20)
CO2: 28 mmol/L (ref 22–32)
Calcium: 9.5 mg/dL (ref 8.9–10.3)
Chloride: 101 mmol/L (ref 98–111)
Creatinine, Ser: 0.93 mg/dL (ref 0.61–1.24)
GFR, Estimated: >60 mL/min (ref >60)
Glucose, Bld: 95 mg/dL (ref 70–99)
Potassium: 4.1 mmol/L (ref 3.5–5.1)
Sodium: 137 mmol/L (ref 135–145)

## 2020-11-12 LAB — RESP PANEL BY RT-PCR (FLU A&B, COVID) ARPGX2
Influenza A by PCR: NEGATIVE
Influenza B by PCR: NEGATIVE
SARS Coronavirus 2 by RT PCR: POSITIVE — AB

## 2020-11-12 LAB — PROTIME-INR
INR: 1 (ref 0.8–1.2)
Prothrombin Time: 12.6 seconds (ref 11.4–15.2)

## 2020-11-12 LAB — BRAIN NATRIURETIC PEPTIDE: B Natriuretic Peptide: 46.8 pg/mL (ref 0.0–100.0)

## 2020-11-12 LAB — TROPONIN I (HIGH SENSITIVITY)
Troponin I (High Sensitivity): 19 ng/L — ABNORMAL HIGH (ref <18)
Troponin I (High Sensitivity): 17 ng/L (ref <18)

## 2020-11-12 LAB — APTT: aPTT: 28 seconds (ref 24–36)

## 2020-11-12 LAB — HEPATIC FUNCTION PANEL
ALT: 71 U/L — ABNORMAL HIGH (ref 0–44)
AST: 29 U/L (ref 15–41)
Albumin: 4.2 g/dL (ref 3.5–5.0)
Alkaline Phosphatase: 52 U/L (ref 38–126)
Bilirubin, Direct: <0.1 mg/dL (ref 0.0–0.2)
Total Bilirubin: 0.6 mg/dL (ref 0.3–1.2)
Total Protein: 7.1 g/dL (ref 6.5–8.1)

## 2020-11-12 LAB — HEPARIN LEVEL (UNFRACTIONATED): Heparin Unfractionated: 0.31 IU/mL (ref 0.30–0.70)

## 2020-11-12 SURGERY — PULMONARY THROMBECTOMY
Anesthesia: Moderate Sedation | Laterality: Bilateral

## 2020-11-12 SURGERY — PULMONARY THROMBECTOMY
Anesthesia: Moderate Sedation

## 2020-11-12 MED ORDER — HEPARIN SODIUM (PORCINE) 1000 UNIT/ML IJ SOLN
INTRAMUSCULAR | Status: AC
  Filled 2020-11-12: qty 1

## 2020-11-12 MED ORDER — HEPARIN SODIUM (PORCINE) 1000 UNIT/ML IJ SOLN
INTRAMUSCULAR | Status: DC | PRN
  Administered 2020-11-12: 3000 [IU] via INTRAVENOUS

## 2020-11-12 MED ORDER — DIPHENHYDRAMINE HCL 50 MG/ML IJ SOLN: 50.0000 mg | Freq: Once | INTRAMUSCULAR | Status: DC | PRN

## 2020-11-12 MED ORDER — IOHEXOL 350 MG/ML SOLN
75.0000 mL | Freq: Once | INTRAVENOUS | Status: AC | PRN
  Administered 2020-11-12: 75 mL via INTRAVENOUS

## 2020-11-12 MED ORDER — ONDANSETRON HCL 4 MG/2ML IJ SOLN
4.0000 mg | Freq: Four times a day (QID) | INTRAMUSCULAR | Status: DC | PRN
Start: 2020-11-12 — End: 2020-11-13
  Administered 2020-11-12: 4 mg via INTRAVENOUS
  Filled 2020-11-12: qty 2

## 2020-11-12 MED ORDER — MIDAZOLAM HCL 5 MG/5ML IJ SOLN
INTRAMUSCULAR | Status: AC
  Filled 2020-11-12: qty 5

## 2020-11-12 MED ORDER — ASPIRIN 81 MG PO CHEW
81.0000 mg | CHEWABLE_TABLET | Freq: Every day | ORAL | Status: DC
  Administered 2020-11-13 – 2020-11-18 (×6): 81 mg via ORAL
  Filled 2020-11-12 (×6): qty 1

## 2020-11-12 MED ORDER — METOPROLOL TARTRATE 25 MG PO TABS
25.0000 mg | ORAL_TABLET | Freq: Two times a day (BID) | ORAL | Status: DC
  Administered 2020-11-12 – 2020-11-18 (×12): 25 mg via ORAL
  Filled 2020-11-12 (×12): qty 1

## 2020-11-12 MED ORDER — ATORVASTATIN CALCIUM 20 MG PO TABS: 80.0000 mg | ORAL_TABLET | Freq: Every day | ORAL | Status: DC

## 2020-11-12 MED ORDER — CEFAZOLIN SODIUM-DEXTROSE 1-4 GM/50ML-% IV SOLN
INTRAVENOUS | Status: AC
  Filled 2020-11-12: qty 100

## 2020-11-12 MED ORDER — HEPARIN (PORCINE) 25000 UT/250ML-% IV SOLN
1700.0000 [IU]/h | INTRAVENOUS | Status: DC
  Administered 2020-11-12 – 2020-11-13 (×2): 1500 [IU]/h via INTRAVENOUS
  Filled 2020-11-12 (×2): qty 250

## 2020-11-12 MED ORDER — ONDANSETRON HCL 4 MG/2ML IJ SOLN
4.0000 mg | Freq: Once | INTRAMUSCULAR | Status: AC
  Administered 2020-11-12: 4 mg via INTRAVENOUS
  Filled 2020-11-12: qty 2

## 2020-11-12 MED ORDER — MORPHINE SULFATE (PF) 4 MG/ML IV SOLN
4.0000 mg | Freq: Once | INTRAVENOUS | Status: AC
  Administered 2020-11-12: 4 mg via INTRAVENOUS
  Filled 2020-11-12: qty 1

## 2020-11-12 MED ORDER — FAMOTIDINE 20 MG PO TABS: 40.0000 mg | ORAL_TABLET | Freq: Once | ORAL | Status: DC | PRN

## 2020-11-12 MED ORDER — HEPARIN BOLUS VIA INFUSION
5500.0000 [IU] | Freq: Once | INTRAVENOUS | Status: AC
  Administered 2020-11-12: 5500 [IU] via INTRAVENOUS
  Filled 2020-11-12: qty 5500

## 2020-11-12 MED ORDER — SODIUM CHLORIDE 0.9 % IV SOLN
INTRAVENOUS | Status: DC
Start: 2020-11-12 — End: 2020-11-12

## 2020-11-12 MED ORDER — ATORVASTATIN CALCIUM 20 MG PO TABS
80.0000 mg | ORAL_TABLET | Freq: Every day | ORAL | Status: DC
  Administered 2020-11-12 – 2020-11-17 (×6): 80 mg via ORAL
  Filled 2020-11-12 (×2): qty 1
  Filled 2020-11-12: qty 4
  Filled 2020-11-12: qty 1
  Filled 2020-11-12: qty 4
  Filled 2020-11-12: qty 1

## 2020-11-12 MED ORDER — ONDANSETRON HCL 4 MG PO TABS: 4.0000 mg | ORAL_TABLET | Freq: Four times a day (QID) | ORAL | Status: DC | PRN

## 2020-11-12 MED ORDER — MIDAZOLAM HCL 2 MG/2ML IJ SOLN
INTRAMUSCULAR | Status: DC | PRN
  Administered 2020-11-12: 1 mg via INTRAVENOUS
  Administered 2020-11-12: 2 mg via INTRAVENOUS
  Administered 2020-11-12: 1 mg via INTRAVENOUS

## 2020-11-12 MED ORDER — ACETAMINOPHEN 650 MG RE SUPP
650.0000 mg | Freq: Four times a day (QID) | RECTAL | Status: AC | PRN
Start: 2020-11-12 — End: 2020-11-15
  Filled 2020-11-12: qty 1

## 2020-11-12 MED ORDER — HYDROMORPHONE HCL 1 MG/ML IJ SOLN
1.0000 mg | Freq: Once | INTRAMUSCULAR | Status: AC | PRN
  Administered 2020-11-12: 1 mg via INTRAVENOUS
  Filled 2020-11-12: qty 1

## 2020-11-12 MED ORDER — IODIXANOL 320 MG/ML IV SOLN
INTRAVENOUS | Status: DC | PRN
  Administered 2020-11-12: 45 mL via INTRAVENOUS

## 2020-11-12 MED ORDER — FENTANYL CITRATE (PF) 100 MCG/2ML IJ SOLN
INTRAMUSCULAR | Status: DC | PRN
  Administered 2020-11-12: 25 ug via INTRAVENOUS
  Administered 2020-11-12: 50 ug via INTRAVENOUS
  Administered 2020-11-12: 25 ug via INTRAVENOUS

## 2020-11-12 MED ORDER — FENTANYL CITRATE PF 50 MCG/ML IJ SOSY
PREFILLED_SYRINGE | INTRAMUSCULAR | Status: AC
  Filled 2020-11-12: qty 2

## 2020-11-12 MED ORDER — ONDANSETRON HCL 4 MG/2ML IJ SOLN: 4.0000 mg | Freq: Four times a day (QID) | INTRAMUSCULAR | Status: DC | PRN

## 2020-11-12 MED ORDER — MIDAZOLAM HCL 2 MG/ML PO SYRP
8.0000 mg | ORAL_SOLUTION | Freq: Once | ORAL | Status: DC | PRN
  Filled 2020-11-12: qty 4

## 2020-11-12 MED ORDER — MORPHINE SULFATE (PF) 2 MG/ML IV SOLN
2.0000 mg | INTRAVENOUS | Status: AC | PRN
  Administered 2020-11-12 – 2020-11-13 (×4): 2 mg via INTRAVENOUS
  Filled 2020-11-12 (×4): qty 1

## 2020-11-12 MED ORDER — CEFAZOLIN SODIUM-DEXTROSE 2-4 GM/100ML-% IV SOLN
2.0000 g | Freq: Once | INTRAVENOUS | Status: AC
  Administered 2020-11-12: 2 g via INTRAVENOUS
  Filled 2020-11-12: qty 100

## 2020-11-12 MED ORDER — TICAGRELOR 90 MG PO TABS
90.0000 mg | ORAL_TABLET | Freq: Two times a day (BID) | ORAL | Status: DC
  Administered 2020-11-12 – 2020-11-18 (×12): 90 mg via ORAL
  Filled 2020-11-12 (×13): qty 1

## 2020-11-12 MED ORDER — ACETAMINOPHEN 325 MG PO TABS: 650.0000 mg | ORAL_TABLET | Freq: Four times a day (QID) | ORAL | Status: AC | PRN

## 2020-11-12 MED ORDER — METHYLPREDNISOLONE SODIUM SUCC 125 MG IJ SOLR: 125.0000 mg | Freq: Once | INTRAMUSCULAR | Status: DC | PRN

## 2020-11-12 MED ORDER — LISINOPRIL 10 MG PO TABS
5.0000 mg | ORAL_TABLET | Freq: Every day | ORAL | Status: DC
  Administered 2020-11-13 – 2020-11-18 (×6): 5 mg via ORAL
  Filled 2020-11-12 (×6): qty 1

## 2020-11-12 SURGICAL SUPPLY — 12 items
CANISTER PENUMBRA ENGINE (MISCELLANEOUS) ×2 IMPLANT
CATH ANGIO 5F PIGTAIL 100CM (CATHETERS) ×2 IMPLANT
CATH INDIGO SEP 8 (CATHETERS) ×2 IMPLANT
CATH INFINITI JR4 5F (CATHETERS) ×2 IMPLANT
CATH LIGHTNING 8 XTORQ 115 (CATHETERS) ×2 IMPLANT
COVER PROBE U/S 5X48 (MISCELLANEOUS) ×2 IMPLANT
GLIDEWIRE ADV .035X180CM (WIRE) ×2 IMPLANT
PACK ANGIOGRAPHY (CUSTOM PROCEDURE TRAY) ×2 IMPLANT
SHEATH 9FRX11 (SHEATH) ×2 IMPLANT
SYR MEDRAD MARK 7 150ML (SYRINGE) ×2 IMPLANT
TUBING CONTRAST HIGH PRESS 72 (TUBING) ×2 IMPLANT
WIRE MAGIC TORQUE 260C (WIRE) ×2 IMPLANT
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
## 2020-11-12 NOTE — Op Note (Signed)
[facility]  Percutaneous Study/Intervention Procedural Note   Date of Surgery: 11/12/2020,4:47 PM  Surgeon: Festus Barren  Pre-operative Diagnosis: Symptomatic bilateral pulmonary emboli  Post-operative diagnosis:  Same  Procedure(s) Performed:  1.  Contrast injection right heart  2.   Mechanical thrombectomy right lower lobe pulmonary artery in the left upper and lower lobe pulmonary arteries  3.  Selective catheter placement right lower lobe pulmonary artery  4.  Selective catheter placement left lower lobe and upper lobe pulmonary artery    Anesthesia: Conscious sedation was administered under my direct supervision by the interventional radiology RN. IV Versed plus fentanyl were utilized. Continuous ECG, pulse oximetry and blood pressure was monitored throughout the entire procedure.  Versed and fentanyl were administered intravenously.  Conscious sedation was administered for a total of 25 minutes using 4 mg of Versed and 100 mcg of Fentanyl.  EBL: 350 cc  Sheath: 9 French right femoral vein  Contrast: 45 cc   Fluoroscopy Time: 6.9 minutes  Indications:  Patient presents with pulmonary emboli. The patient is symptomatic with hypoxemia and dyspnea on exertion.  There is evidence of right heart strain on the CT angiogram. The patient is otherwise a good candidate for intervention and even the long-term benefits pulmonary angiography with thrombolysis is offered. The risks and benefits are reviewed long-term benefits are discussed. All questions are answered patient agrees to proceed.  Procedure:  [name] a 35 y.o. male who was identified and appropriate procedural time out was performed.  The patient was then placed supine on the table and prepped and draped in the usual sterile fashion.  Ultrasound was used to evaluate the right common femoral vein.  It was patent, as it was echolucent and compressible.  A digital ultrasound image was acquired for the  permanent record.  A Seldinger needle was used to access the right common femoral vein under direct ultrasound guidance.  A 0.035 J wire was advanced without resistance and a 5Fr sheath was placed and then upsized to an 8 [place] sheath.    The wire and pigtail catheter were then negotiated into the right atrium and bolus injection of contrast was utilized to demonstrate the right ventricle and the pulmonary artery outflow. The advantage wire and JR4 catheter were then negotiated into the main pulmonary artery and then into the left lower lobe pulmonary artery where selective imaging showed a small to medium amount of thrombus.  Was then negotiated into the left upper lobe pulmonary artery and selective imaging showed a small to medium amount of thrombus.  Using the advantage wire the JR4 catheter was then navigated over to the right side and down to the right lower lobe based off of the CT scan where imaging showed a small to medium amount of pulmonary embolus in the right lower lobe pulmonary artery. 3000 units of heparin was then given and allowed to circulate.   The Penumbra Cat 8 catheter was then advanced up into the pulmonary vasculature. The right lung was addressed first. Catheter was negotiated into the right lower lobe pulmonary artery and mechanical thrombectomy was performed with the penumbra CAT 8 catheter and the separator.  Follow-up imaging showed no obvious residual thrombus so I turned my attention to the left side.  The Penumbra Cat 8 catheter was then negotiated to the opposite side. The left lung was then addressed. Catheter was negotiated into the left lower lobe pulmonary artery and mechanical thrombectomy was performed again with the penumbra CAT  8 catheter. Follow-up imaging demonstrated a good result and therefore the catheter was renegotiated into the left upper lobe pulmonary artery and again mechanical thrombectomy was performed. Passes were made with both the Penumbra catheter  itself as well as introducing the separator. Follow-up imaging was then performed.  The residual thrombus burden was extremely small at this point so I elected to terminate the procedure.  After review these images wires were reintroduced and the catheters removed. Then, the sheath is then pulled and pressures held. A safeguard is placed.    Findings:   Right heart imaging:  Right atrium and right ventricle and the pulmonary outflow tract appears relatively normal  Right lung: Small to medium amount of thrombus in the right lower lobe pulmonary artery  Left lung: Small to medium amount of thrombus in the left upper lobe and left lower lobe pulmonary arteries    Disposition: Patient was taken to the recovery room in stable condition having tolerated the procedure well.  [name] 11/12/2020,4:47 PM
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
## 2020-11-12 NOTE — Consult Note (Addendum)
ANTICOAGULATION CONSULT NOTE  Pharmacy Consult for heparin  Indication: pulmonary embolus  No Known Allergies  Patient Measurements: Height: 5\' 10"  (177.8 cm) Weight: 91.2 kg (201 lb) IBW/kg (Calculated) : 73 Heparin Dosing Weight: 91.2 kg  Vital Signs: Temp: 97.8 F (36.6 C) (10/06 1822) Temp Source: Oral (10/06 1022) BP: 98/58 (10/06 1822) Pulse Rate: 74 (10/06 1822)  Labs: Recent Labs    11/12/20 1020 11/12/20 1225 11/12/20 1858  HGB 14.0  --   --   HCT 39.3  --   --   PLT 266  --   --   APTT  --  28  --   LABPROT  --  12.6  --   INR  --  1.0  --   HEPARINUNFRC  --   --  0.31  CREATININE 0.93  --   --   TROPONINIHS 19* 17  --      Estimated Creatinine Clearance: 125.9 mL/min (by C-G formula based on SCr of 0.93 mg/dL).   Medications:  No pertinent allergies No PTA anticoagulation PTA: ASA 81 mg daily and Brilinta 90 mg BID   Assessment: 35 y.o. male with medical history significant for CAD status post PCI with DES to the proximal RCA in September 2022, tobacco abuse, HTN, HLD, who presents to the ED with complaints of shortness of breath. CTA chest positive for for acute segmental and subsegmental PE within the right and left lower lobe and left upper lobe. Pharmacy has been consulted for heparin dosing.   Baseline labs:  aPTT 28 INR 1.0 Hgb 14 Plts 266  Monitoring: 1006 1858 HL 0.31 @ 1500 units/hr therapeutic x 1  Goal of Therapy:  Heparin level 0.3-0.7 units/ml Monitor platelets by anticoagulation protocol: Yes   Plan:  Therapeutic x 1 = Continue heparin @ 1500 units/hr Recheck HL in 6 hours per protocol for confirmation CBC's daily while on heparin drip   03-31-1985, PharmD, BCPS Clinical Pharmacist 11/12/2020 7:24 PM

## 2020-11-12 NOTE — Consult Note (Signed)
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 stenosis. Aortic Valve: The aortic valve is normal in structure. Aortic valve regurgitation is not visualized. No aortic stenosis is present. Aortic valve peak gradient measures 4.2 mmHg. Pulmonic Valve: The pulmonic valve was normal in structure. Pulmonic valve regurgitation is not visualized. No evidence of pulmonic stenosis. Aorta: The aortic root is normal in size and structure. Venous: The inferior vena cava is normal in size with greater than 50% respiratory variability, suggesting right atrial pressure of 3 mmHg. IAS/Shunts: No atrial level shunt detected by color flow Doppler.  LEFT VENTRICLE PLAX 2D LVIDd:         5.10 cm  Diastology LVIDs:         4.20 cm  LV e' medial:    7.40 cm/s LV PW:         1.00 cm  LV E/e' medial:  8.3 LV IVS:        0.90 cm  LV e' lateral:   9.57 cm/s LVOT diam:  2.10 cm  LV E/e' lateral: 6.4 LVOT Area:     3.46 cm  RIGHT VENTRICLE RV Basal diam:  3.60 cm RV Mid diam:    2.90 cm RV S prime:     10.60 cm/s LEFT ATRIUM             Index       RIGHT ATRIUM           Index LA diam:        3.40 cm 1.75 cm/m  RA Area:     11.00 cm LA Vol (A2C):   36.9 ml 19.04 ml/m RA Volume:   24.00 ml  12.38  ml/m LA Vol (A4C):   31.0 ml 15.99 ml/m LA Biplane Vol: 33.9 ml 17.49 ml/m  AORTIC VALVE                PULMONIC VALVE AV Area (Vmax): 2.52 cm    PV Vmax:          0.77 m/s AV Vmax:        102.00 cm/s PV Peak grad:     2.4 mmHg AV Peak Grad:   4.2 mmHg    PR End Diast Vel: 3.82 msec LVOT Vmax:      74.10 cm/s  RVOT Peak grad:   1 mmHg  AORTA Ao Root diam: 2.60 cm MITRAL VALVE               TRICUSPID VALVE MV Area (PHT): 2.68 cm    TR Peak grad:   15.5 mmHg MV Decel Time: 283 msec    TR Vmax:        197.00 cm/s MV E velocity: 61.30 cm/s MV A velocity: 48.00 cm/s  SHUNTS MV E/A ratio:  1.28        Systemic Diam: 2.10 cm MV A Prime:    8.4 cm/s Isaias Cowman MD Electronically signed by Isaias Cowman MD Signature Date/Time: 10/26/2020/8:45:14 AM    Final    US Abdomen Limited RUQ (LIVER/GB)  Result Date: 10/26/2020 CLINICAL DATA:  Increased LFTs EXAM: ULTRASOUND ABDOMEN LIMITED RIGHT UPPER QUADRANT COMPARISON:  None. FINDINGS: Gallbladder: Slight gallbladder wall thickening and trace pericholecystic fluid. Wall thickness measures up to 9 mm. Despite this, no sonographic Murphy's sign elicited. Negative for gallstones. Common bile duct: Diameter: 2.7 mm Liver: No focal lesion identified. Within normal limits in parenchymal echogenicity. Portal vein is patent on color Doppler imaging with normal direction of blood flow towards the liver. Other: No upper abdominal free fluid. IMPRESSION: Gallbladder wall thickening measuring up to 9 mm with trace pericholecystic fluid between the gallbladder and liver surface. Findings remain indeterminate. No Murphy's sign elicited. Negative for gallstones. No biliary dilatation or obstruction pattern No focal hepatic abnormality. Electronically Signed   By: Jerilynn Mages.  Shick M.D.   On: 10/26/2020 08:16    Assessment/Plan [name] is a 35 year old male with medical history significant for CAD s/p PCI with DES to the proximal RCA in September 2022, tobacco abuse,  hypertension, hyperlipidemia, who presents the emergency department via private vehicle from home for chief concerns of new onset shortness of breath and left-sided chest pain that started evening of 11/10/2020 found to have acute bilateral pulmonary embolism.  1.  Pulmonary Embolism: Patient presents to our emergency department with progressively worsening shortness of breath and chest pain.  CTA of the chest was notable for acute bilateral pulmonary embolism with possible pulmonary infarct.  There does not seem to be right heart strain  on CTA however the patient did experience an MI in September 2022.  S/p PCI with DES to the proximal RCA in September 2022. Patient has tested positive for COVID which could be a contributing factor to his pulmonary embolism. Heparin has been initiated.  In the setting of progressively worsening shortness of breath and chest pain with acute bilateral pulmonary embolism / pulmonary infarct in a patient with recent MI in September 2022 recommend undergoing pulmonary thrombectomy/thrombolysis in an attempt to lessen the clot burden, improve his symptoms, and take any strain off of a compromise cardiovascular system.  Procedure, risks and benefits were explained to the patient.  All questions were answered.  The patient wishes to proceed.  We will plan on this this afternoon.  2.  DVT: No evidence of DVT to the bilateral lower extremity on venous duplex conducted on November 13, 2018.  3.  COVID-positive: Patient has tested COVID positive. His wife recently had COVID. Contact/airborne precautions  Discussed with Dr. Mayme Genta, PA-C  11/12/2020 2:02 PM  This note was created with Dragon medical transcription system.  Any error is purely unintentional.

## 2020-11-12 NOTE — Consult Note (Signed)
ANTICOAGULATION CONSULT NOTE - Follow Up Consult  Pharmacy Consult for heparin  Indication: pulmonary embolus  No Known Allergies  Patient Measurements: Height: 5\' 10"  (177.8 cm) Weight: 91.2 kg (201 lb) IBW/kg (Calculated) : 73 Heparin Dosing Weight: 91.2 kg  Vital Signs: Temp: 98.4 F (36.9 C) (10/06 1022) Temp Source: Oral (10/06 1022) BP: 128/80 (10/06 1022) Pulse Rate: 77 (10/06 1022)  Labs: Recent Labs    11/12/20 1020  HGB 14.0  HCT 39.3  PLT 266  CREATININE 0.93  TROPONINIHS 19*    Estimated Creatinine Clearance: 125.9 mL/min (by C-G formula based on SCr of 0.93 mg/dL).   Medications:  No pertinent allergies No PTA anticoagulation PTA: ASA 81 mg daily and Brilinta 90 mg BID   Assessment: 35 y.o. male with medical history significant for CAD status post PCI with DES to the proximal RCA in September 2022, tobacco abuse, HTN, HLD, who presents to the ED with complaints of shortness of breath. CTA chest positive for for acute segmental and subsegmental PE within the right and left lower lobe and left upper lobe. Pharmacy has been consulted for heparin dosing.   Baseline labs:  aPTT 28 INR 1.0 Hgb 14 Plts 266  Goal of Therapy:  Heparin level 0.3-0.7 units/ml Monitor platelets by anticoagulation protocol: Yes   Plan:  Give 5500 units bolus x 1 Start heparin infusion at 1500 units/hr Check anti-Xa level in 6 hours and daily while on heparin Continue to monitor H&H and platelets  03-31-1985, PharmD 11/12/2020,12:14 PM

## 2020-11-12 NOTE — ED Triage Notes (Signed)
Pt in via POV, reports new onset shortness of breath and left side chest pain since last night.  Pt with recent MI, states he has a life vest that he is supposed to be wearing but has currently removed it due to discomfort.    Denies any other associated symptoms; dyspneic and appears uncomfortable in triage.  Vitals WDL.

## 2020-11-13 ENCOUNTER — Inpatient Hospital Stay
Admit: 2020-11-13 | Discharge: 2020-11-13 | Disposition: A | Discharge status: Home / Self Care | Payer: Medicaid Other | Attending: Vascular Surgery | Admitting: Vascular Surgery

## 2020-11-13 ENCOUNTER — Encounter: Payer: Self-pay | Admitting: Vascular Surgery

## 2020-11-13 DIAGNOSIS — I252 Old myocardial infarction: Secondary | ICD-10-CM | POA: Diagnosis not present

## 2020-11-13 DIAGNOSIS — Z7902 Long term (current) use of antithrombotics/antiplatelets: Secondary | ICD-10-CM | POA: Diagnosis not present

## 2020-11-13 DIAGNOSIS — I2699 Other pulmonary embolism without acute cor pulmonale: Secondary | ICD-10-CM

## 2020-11-13 DIAGNOSIS — D72829 Elevated white blood cell count, unspecified: Secondary | ICD-10-CM

## 2020-11-13 DIAGNOSIS — Z9861 Coronary angioplasty status: Secondary | ICD-10-CM | POA: Diagnosis not present

## 2020-11-13 DIAGNOSIS — J9602 Acute respiratory failure with hypercapnia: Secondary | ICD-10-CM | POA: Diagnosis present

## 2020-11-13 DIAGNOSIS — E785 Hyperlipidemia, unspecified: Secondary | ICD-10-CM | POA: Diagnosis present

## 2020-11-13 DIAGNOSIS — I2119 ST elevation (STEMI) myocardial infarction involving other coronary artery of inferior wall: Secondary | ICD-10-CM

## 2020-11-13 DIAGNOSIS — H538 Other visual disturbances: Secondary | ICD-10-CM | POA: Diagnosis present

## 2020-11-13 DIAGNOSIS — I2510 Atherosclerotic heart disease of native coronary artery without angina pectoris: Secondary | ICD-10-CM | POA: Diagnosis present

## 2020-11-13 DIAGNOSIS — I10 Essential (primary) hypertension: Secondary | ICD-10-CM | POA: Diagnosis present

## 2020-11-13 DIAGNOSIS — I509 Heart failure, unspecified: Secondary | ICD-10-CM

## 2020-11-13 DIAGNOSIS — J9601 Acute respiratory failure with hypoxia: Secondary | ICD-10-CM

## 2020-11-13 DIAGNOSIS — Z28310 Unvaccinated for covid-19: Secondary | ICD-10-CM | POA: Diagnosis not present

## 2020-11-13 DIAGNOSIS — I255 Ischemic cardiomyopathy: Secondary | ICD-10-CM | POA: Diagnosis present

## 2020-11-13 DIAGNOSIS — Z8249 Family history of ischemic heart disease and other diseases of the circulatory system: Secondary | ICD-10-CM | POA: Diagnosis not present

## 2020-11-13 DIAGNOSIS — U071 COVID-19: Secondary | ICD-10-CM | POA: Diagnosis present

## 2020-11-13 DIAGNOSIS — I2694 Multiple subsegmental pulmonary emboli without acute cor pulmonale: Secondary | ICD-10-CM | POA: Diagnosis present

## 2020-11-13 DIAGNOSIS — Z79899 Other long term (current) drug therapy: Secondary | ICD-10-CM | POA: Diagnosis not present

## 2020-11-13 DIAGNOSIS — Z87891 Personal history of nicotine dependence: Secondary | ICD-10-CM | POA: Diagnosis not present

## 2020-11-13 DIAGNOSIS — G5791 Unspecified mononeuropathy of right lower limb: Secondary | ICD-10-CM | POA: Diagnosis present

## 2020-11-13 DIAGNOSIS — R7401 Elevation of levels of liver transaminase levels: Secondary | ICD-10-CM

## 2020-11-13 DIAGNOSIS — Z7982 Long term (current) use of aspirin: Secondary | ICD-10-CM | POA: Diagnosis not present

## 2020-11-13 LAB — D-DIMER, QUANTITATIVE: D-Dimer, Quant: 0.91 ug/mL-FEU — ABNORMAL HIGH (ref 0.00–0.50)

## 2020-11-13 LAB — HEPARIN LEVEL (UNFRACTIONATED)
Heparin Unfractionated: 0.27 IU/mL — ABNORMAL LOW (ref 0.30–0.70)
Heparin Unfractionated: 0.46 IU/mL (ref 0.30–0.70)

## 2020-11-13 LAB — BASIC METABOLIC PANEL
Anion gap: 8 (ref 5–15)
BUN: 12 mg/dL (ref 6–20)
CO2: 30 mmol/L (ref 22–32)
Calcium: 9.2 mg/dL (ref 8.9–10.3)
Chloride: 99 mmol/L (ref 98–111)
Creatinine, Ser: 1.09 mg/dL (ref 0.61–1.24)
GFR, Estimated: >60 mL/min (ref >60)
Glucose, Bld: 110 mg/dL — ABNORMAL HIGH (ref 70–99)
Potassium: 4.1 mmol/L (ref 3.5–5.1)
Sodium: 137 mmol/L (ref 135–145)

## 2020-11-13 LAB — CBC
HCT: 35.6 % — ABNORMAL LOW (ref 39.0–52.0)
Hemoglobin: 12.3 g/dL — ABNORMAL LOW (ref 13.0–17.0)
MCH: 31.3 pg (ref 26.0–34.0)
MCHC: 34.6 g/dL (ref 30.0–36.0)
MCV: 90.6 fL (ref 80.0–100.0)
Platelets: 226 10*3/uL (ref 150–400)
RBC: 3.93 MIL/uL — ABNORMAL LOW (ref 4.22–5.81)
RDW: 13.2 % (ref 11.5–15.5)
WBC: 12.7 10*3/uL — ABNORMAL HIGH (ref 4.0–10.5)
nRBC: 0 % (ref 0.0–0.2)

## 2020-11-13 LAB — FERRITIN: Ferritin: 338 ng/mL — ABNORMAL HIGH (ref 24–336)

## 2020-11-13 LAB — C-REACTIVE PROTEIN: CRP: 9.2 mg/dL — ABNORMAL HIGH (ref <1.0)

## 2020-11-13 LAB — LACTATE DEHYDROGENASE: LDH: 134 U/L (ref 98–192)

## 2020-11-13 MED ORDER — APIXABAN 5 MG PO TABS: 5.0000 mg | ORAL_TABLET | Freq: Two times a day (BID) | ORAL | Status: DC

## 2020-11-13 MED ORDER — HEPARIN BOLUS VIA INFUSION
1400.0000 [IU] | Freq: Once | INTRAVENOUS | Status: AC
  Administered 2020-11-13: 1400 [IU] via INTRAVENOUS
  Filled 2020-11-13: qty 1400

## 2020-11-13 MED ORDER — OXYCODONE HCL 5 MG PO TABS
5.0000 mg | ORAL_TABLET | ORAL | Status: DC | PRN
  Administered 2020-11-13 – 2020-11-14 (×7): 10 mg via ORAL
  Administered 2020-11-14: 5 mg via ORAL
  Administered 2020-11-15 – 2020-11-18 (×18): 10 mg via ORAL
  Filled 2020-11-13 (×26): qty 2

## 2020-11-13 MED ORDER — APIXABAN 5 MG PO TABS
10.0000 mg | ORAL_TABLET | Freq: Two times a day (BID) | ORAL | Status: DC
  Administered 2020-11-13 – 2020-11-18 (×11): 10 mg via ORAL
  Filled 2020-11-13 (×11): qty 2

## 2020-11-13 NOTE — Progress Notes (Signed)
*  PRELIMINARY RESULTS* Echocardiogram 2D Echocardiogram has been performed.  [name] 11/13/2020, 10:58 AM

## 2020-11-13 NOTE — Progress Notes (Signed)
Pt has not voided all night, pt denies any urge to pee. Pt is A/Ox 4, denies any need to void at this time. Bladder is not distended or uncomfortable per assessment. Pt stated " I don't void at night naturally and I will do later in the morning".

## 2020-11-13 NOTE — Progress Notes (Signed)
PROGRESS NOTE  [name] NGE:[id number] DOB: 01-27-1986 DOA: 11/12/2020 PCP: Oneita Hurt, No  Brief History    [name] is a 35 y.o. male with medical history significant for CAD status post PCI with DES to the proximal RCA in September 2022, tobacco abuse, hypertension, hyperlipidemia, who presents the emergency department via private vehicle from home for chief concerns of new onset shortness of breath and left-sided chest pain that started evening of 11/11/2020.   He noticed the chest pain like 'i just pulled a muscle' while he was walking in [place] and holding his son (19 months). This was approximately 11/11:30 AM.  Patient presumed he had pulled a muscle as he states, "my son is chunky" therefore he did not present to the ED for further evaluation.   The symptoms progressively worsened, notably around 7 pm.  Patient reports that the chest discomfort and shortness of breath became unbearable, with inhalation caused worsening shortness of breath.  He reports that sitting still will minimally improve his symptoms.   The patient was found to have bilateral pulmonary emboli. He has been started on Eliquis. He continues to have severe chest pain.  Echocardiogram does not demonstrate right heart strain.  Hospitalization from 10/23/2020 to 10/26/2020: Patient came to the ED by private vehicle with noted chief concerns of chest pain.  His spouse reported that patient was passing out in the car.  On arrival to the emergency department patient was noted to be pulseless and CPR was initiated.  Patient was resuscitated he underwent cardiac catheterization and was noted to have occluded RCA.  Patient was also intubated and mechanically ventilated.  He is status post extubation on 10/24/2020.  He was noted to also have aspiration pneumonia likely secondary to vomiting episode prior to intubation.  He was noted to have gastric contents in airway during intubation.  Patient was started on Unasyn and  subsequently transition to Augmentin.  He was also on amiodarone drip and this was discontinued on 10/25/2020.   He was also noted to have elevated liver enzymes which was presumed reactive to underlying hemodynamic instability during resuscitation.  Right upper quadrant ultrasound was performed and did not show any biliary ductal dilatation or stones.  Overall LFTs were improving prior to patient discharge.   Patient noted to have new neurological deficits, specifically he had decreased sensation in his right lower extremity as well as blurry visions.  Neurology was consulted.  MRI of the brain was performed and found to be unremarkable.  Neurology, Dr. Cena Benton at the time recommended further outpatient work-up as appropriate.   A work-up for Raynauds disease was performed as well and ANA, factor V Leiden were ordered and found to be negative.   Patient was discharged with a LifeVest.   LHC 10/23/2020: Patient had a left heart cath with impressions reading inferior STEMI and status post successful primary PCI with DES to ostial/proximal RCA; preserved left ventricular function, with estimated LV ejection fraction 30%. - Recommendations at this cath was: Dual antiplatelet and uninterrupted for 1 year, high intensity atorvastatin, 2D echocardiogram   2D echocardiogram on 10/25/2020 was read as LVEF of 40 to 45%:   DVT prophylaxis: Heparin GTT Code Status: Full code Diet: N.p.o. except for sips with meds Family Communication: Updated and discussed spouse over the phone, Mrs. [name] at [phone] Consultants  None  Procedures  None  Antibiotics  None  Subjective  The patient is resting. He continues to have significant pleuritic chest pain requiring IV  narcotics. He continues to have shortness of breath.  Objective   Vitals:  Vitals:   11/13/20 1300 11/13/20 1841  BP: 121/67 106/73  Pulse: 77 93  Resp: 18 20  Temp: 98.2 F (36.8 C) 98.5 F (36.9 C)  SpO2: 94% 95%     Exam:  Constitutional:  Awake, alert, and oriented x 3. Mild distress from shortness of breath. Respiratory:  CTA bilaterally, no w/r/r.  Respiratory effort normal. No retractions or accessory muscle use Cardiovascular:  RRR, no m/r/g No LE extremity edema   Normal pedal pulses Abdomen:  Abdomen appears normal; no tenderness or masses No hernias No HSM Musculoskeletal:  Digits/nails BUE: no clubbing, cyanosis, petechiae, infection exam of joints, bones, muscles of at least one of following: head/neck, RUE, LUE, RLE, LLE   Skin:  No rashes, lesions, ulcers palpation of skin: no induration or nodules Neurologic:  CN 2-12 intact Sensation all 4 extremities intact Psychiatric:  Mental status Mood, affect appropriate Orientation to person, place, time  judgment and insight appear intact     I have personally reviewed the following:   Today's Data  Vitals  Lab Data  CBC, BMP  Imaging  CTA chest  Cardiology Data  EKG Echocardiogram  Scheduled Meds:  apixaban  10 mg Oral BID   Followed by   [name] ON 11/20/2020] apixaban  5 mg Oral BID   aspirin  81 mg Oral Daily   atorvastatin  80 mg Oral QHS   lisinopril  5 mg Oral Daily   metoprolol tartrate  25 mg Oral BID   ticagrelor  90 mg Oral BID   Continuous Infusions:  Principal Problem:   Pulmonary embolism (HCC) Active Problems:   STEMI involving oth coronary artery of inferior wall (HCC)   Acute respiratory failure with hypoxia and hypercapnia (HCC)   Transaminitis   Ischemic cardiomyopathy   Leukocytosis   CHF exacerbation (HCC)   LOS: 0 days   A & P   Shortness of breath and chest pain secondary to bilateral pulmonary embolism- - Patient will need outpatient hematology work-up for hypercoagulable state and or carcinoma however given patient is unvaccinated for COVID-19 and his wife was positive for COVID about 1 month ago and patient testing positive for COVID-19 at this time, his hypercoagulable  state is presumed secondary to inflammation from COVID-19 infection  - Recommend PCP and patient to work together on outpatient hematology/oncology work-up - Bilateral lower extremity ultrasound to assess for DVT was ordered - Continue heparin GTT - Vascular, Dr. Wyn Quaker has been consulted via secure chat - Complete echo has been ordered to assess for right heart strain in setting of bilateral pulmonary embolism - Symptomatic support: Morphine 2 mg IV every 3 hours as needed for moderate pain, 4 doses has been ordered - Admit to progressive cardiac, observation, telemetry   # Ischemic cardiomyopathy # CAD # History of STEMI in September 2022 - Status post PCI with 3.0 x 18 mm frontier DES to proximal RCA - Patient was discharged on aspirin 81 mg daily, Brilinta  - Patient has been compliant with aspirin, Brilinta, metoprolol, lisinopril and has taken his a.m. dosing medications prior to presentation - Cardiology outpatient appointment on 11/04/2020 recommended the patient start Plavix 75 mg daily with 300 mg loading dose on day 1 after completion of Brilinta prescription - Metoprolol tartrate 25 mg twice daily resumed  # Hypertension-takes lisinopril 5 mg daily, this has been resumed for 11/13/2020   # Left upper lobe pulmonary 3  mm solid nodule that is read as on CT - Patient will need outpatient follow-up.  - I discussed the results of this 3 mm nodule with patient at bedside and with spouse, Mrs. Orsino over the phone extensively - I extensively discussed with spouse, Mrs. Mcwherter the patient will need to have regular follow-up with the PCP, a repeat CT of the chest in approximately 12 months, and a referral for hematology and oncology outpatient.  Both spouse and patient endorses understanding and compliance   # History of transaminitis-presumed secondary to hemodynamic instability on September 2022 presentation   # Hyperlipidemia - Check LFT and if elevated, we will discontinue atorvastatin   - Atorvastatin 80 mg nightly has been ordered for 11/12/2020    # Right lower extremity neuropathy-patient has outpatient consultation appointment with neurologist on January 08, 2021   # Blurry vision-persistent - Patient to keep outpatient appointment    # History of tobacco abuse-patient quit smoking after STEMI   # History of alcohol use-patient quit smoking after STEMI   # COVID infection -continue airborne and contact precautions - At this time patient is not requiring SPO2 supplementation, no indications for steroids at this time   I have seen and examined this patient myself. I have spent 35 minutes in his evaluation and care.  Riggins Cisek, DO [facility] Direct contact: see [email or web address]  7PM-7AM contact night coverage as above 11/13/2020, 7:18 PM  LOS: 0 days

## 2020-11-13 NOTE — Consult Note (Signed)
ANTICOAGULATION CONSULT NOTE  Pharmacy Consult for heparin  Indication: pulmonary embolus  No Known Allergies  Patient Measurements: Height: 5\' 10"  (177.8 cm) Weight: 91.2 kg (201 lb) IBW/kg (Calculated) : 73 Heparin Dosing Weight: 91.2 kg  Vital Signs: Temp: 97.7 F (36.5 C) (10/07 0035) BP: 100/65 (10/07 0035) Pulse Rate: 76 (10/07 0035)  Labs: Recent Labs    11/12/20 1020 11/12/20 1225 11/12/20 1858 11/13/20 0156  HGB 14.0  --   --  12.3*  HCT 39.3  --   --  35.6*  PLT 266  --   --  226  APTT  --  28  --   --   LABPROT  --  12.6  --   --   INR  --  1.0  --   --   HEPARINUNFRC  --   --  0.31 0.27*  CREATININE 0.93  --   --  1.09  TROPONINIHS 19* 17  --   --      Estimated Creatinine Clearance: 107.4 mL/min (by C-G formula based on SCr of 1.09 mg/dL).   Medications:  No pertinent allergies No PTA anticoagulation PTA: ASA 81 mg daily and Brilinta 90 mg BID   Assessment: 35 y.o. male with medical history significant for CAD status post PCI with DES to the proximal RCA in September 2022, tobacco abuse, HTN, HLD, who presents to the ED with complaints of shortness of breath. CTA chest positive for for acute segmental and subsegmental PE within the right and left lower lobe and left upper lobe. Pharmacy has been consulted for heparin dosing.   Baseline labs:  aPTT 28 INR 1.0 Hgb 14 Plts 266  Monitoring: 1006 1858 HL 0.31 @ 1500 units/hr therapeutic x 1 1007 0156 HL 0.27, subtherapeutic  Goal of Therapy:  Heparin level 0.3-0.7 units/ml Monitor platelets by anticoagulation protocol: Yes   Plan:  Bolus 1400 units x 1 Increase heparin infusion to 1700 units/hr Recheck HL in 6 hours after rate change CBC's daily while on heparin drip  [birth year]-31-1985, PharmD, [facility] 11/13/2020 2:53 AM

## 2020-11-13 NOTE — Plan of Care (Signed)
Care plan reviewed with patient.

## 2020-11-13 NOTE — Progress Notes (Addendum)
[facility] Daily Progress Note  11/12/20:             1.  Contrast injection right heart             2.   Mechanical thrombectomy right lower lobe pulmonary artery in the left upper and lower lobe pulmonary arteries             3.  Selective catheter placement right lower lobe pulmonary artery             4.  Selective catheter placement left lower lobe and upper lobe pulmonary artery  Subjective: Patient without complaint this AM with the exception of a burning sensation in his lungs.  Denies any worsening shortness of breath or chest pain.  No acute issues overnight.  Objective: Vitals:   11/12/20 2113 11/13/20 0035 11/13/20 0423 11/13/20 0813  BP: 130/71 100/65 110/80 100/79  Pulse: 70 76 84 70  Resp: 18 20 20 16   Temp: 98.2 F (36.8 C) 97.7 F (36.5 C) 98.2 F (36.8 C) 97.8 F (36.6 C)  TempSrc:    Oral  SpO2: 97% 96% 97% 99%  Weight:      Height:        Intake/Output Summary (Last 24 hours) at 11/13/2020 1019 Last data filed at 11/13/2020 0831 Gross per 24 hour  Intake 373.34 ml  Output 900 ml  Net -526.66 ml   Physical Exam: A&Ox3, NAD CV: RRR Pulmonary: CTA Bilaterally Abdomen: Soft, Nontender, Nondistended Right Groin:  Access Site: Clean and dry. No swelling.  Vascular:  Bilateral lower extremity: Thigh soft.  Calf soft.  Extremities warm distally to toes.  Good capillary refill.  Minimal edema.  Water/sensory is intact.   Laboratory: CBC    Component Value Date/Time   WBC 12.7 (H) 11/13/2020 0156   HGB 12.3 (L) 11/13/2020 0156   HCT 35.6 (L) 11/13/2020 0156   PLT 226 11/13/2020 0156   BMET    Component Value Date/Time   NA 137 11/13/2020 0156   K 4.1 11/13/2020 0156   CL 99 11/13/2020 0156   CO2 30 11/13/2020 0156   GLUCOSE 110 (H) 11/13/2020 0156   BUN 12 11/13/2020 0156   CREATININE 1.09 11/13/2020 0156   CALCIUM 9.2 11/13/2020 0156   GFRNONAA >60 11/13/2020 0156   GFRAA  01/24/2007 1447    >60        The eGFR has  been calculated using the MDRD equation. This calculation has not been validated in all clinical   Assessment/Planning: The patient is a 35 year old male who presented with pulmonary embolism status post pulmonary thrombectomy and thrombolysis - POD#1  1) Underwent successful pulmonary thrombectomy & thrombolysis.  Notes that his lungs feel like they are on "fire".  Could also be from recent COVID diagnosis.  Overall, the patient feels that he is breathing better. 2) Access site is clean dry and intact 3) 2 g drop in hemoglobin. This is expected during the procedure asymptomatic at this time. 4) Creatinine normal. 5) no DVT on venous duplex 6) Okay to start to ambulate. 7) okay to transition from heparin to oral anticoagulation.   Discussed with Dr. Ellis Parents Kary Sugrue PA-C 11/13/2020 10:19 AM

## 2020-11-13 NOTE — Discharge Instructions (Addendum)
Vascular Surgery Discharge Instructions:  1) you may shower.  Please keep your right groin clean and dry.  Gently clean with soap and water and gently pat dry. 2) please do not engage in strenuous activity or lifting greater than 10 pounds for approximately 2 weeks.

## 2020-11-14 LAB — ECHOCARDIOGRAM COMPLETE
AR max vel: 3.58 cm2
AV Area VTI: 3.42 cm2
AV Area mean vel: 3.44 cm2
AV Mean grad: 2 mmHg
AV Peak grad: 3.2 mmHg
Ao pk vel: 0.89 m/s
Area-P 1/2: 4.06 cm2
Height: 70 in
S' Lateral: 2.95 cm
Weight: 3216 oz

## 2020-11-14 LAB — CBC
HCT: 35.6 % — ABNORMAL LOW (ref 39.0–52.0)
Hemoglobin: 12.8 g/dL — ABNORMAL LOW (ref 13.0–17.0)
MCH: 31.8 pg (ref 26.0–34.0)
MCHC: 36 g/dL (ref 30.0–36.0)
MCV: 88.3 fL (ref 80.0–100.0)
Platelets: 229 10*3/uL (ref 150–400)
RBC: 4.03 MIL/uL — ABNORMAL LOW (ref 4.22–5.81)
RDW: 12.9 % (ref 11.5–15.5)
WBC: 16.1 10*3/uL — ABNORMAL HIGH (ref 4.0–10.5)
nRBC: 0 % (ref 0.0–0.2)

## 2020-11-14 LAB — BASIC METABOLIC PANEL
Anion gap: 7 (ref 5–15)
BUN: 11 mg/dL (ref 6–20)
CO2: 27 mmol/L (ref 22–32)
Calcium: 9.3 mg/dL (ref 8.9–10.3)
Chloride: 98 mmol/L (ref 98–111)
Creatinine, Ser: 0.94 mg/dL (ref 0.61–1.24)
GFR, Estimated: >60 mL/min (ref >60)
Glucose, Bld: 117 mg/dL — ABNORMAL HIGH (ref 70–99)
Potassium: 4.3 mmol/L (ref 3.5–5.1)
Sodium: 132 mmol/L — ABNORMAL LOW (ref 135–145)

## 2020-11-14 MED ORDER — THIAMINE HCL 100 MG PO TABS
100.0000 mg | ORAL_TABLET | Freq: Every day | ORAL | Status: DC
  Administered 2020-11-14 – 2020-11-18 (×5): 100 mg via ORAL
  Filled 2020-11-14 (×5): qty 1

## 2020-11-14 MED ORDER — SODIUM CHLORIDE 0.9 % IV SOLN
200.0000 mg | Freq: Once | INTRAVENOUS | Status: AC
  Administered 2020-11-14: 200 mg via INTRAVENOUS
  Filled 2020-11-14: qty 200

## 2020-11-14 MED ORDER — REMDESIVIR 100 MG IV SOLR
100.0000 mg | Freq: Every day | INTRAVENOUS | Status: AC
  Administered 2020-11-15 – 2020-11-18 (×4): 100 mg via INTRAVENOUS
  Filled 2020-11-14 (×3): qty 100
  Filled 2020-11-14: qty 20

## 2020-11-14 MED ORDER — FOLIC ACID 1 MG PO TABS
1.0000 mg | ORAL_TABLET | Freq: Every day | ORAL | Status: DC
  Administered 2020-11-14 – 2020-11-18 (×5): 1 mg via ORAL
  Filled 2020-11-14 (×5): qty 1

## 2020-11-14 MED ORDER — ADULT MULTIVITAMIN W/MINERALS CH
1.0000 | ORAL_TABLET | Freq: Every day | ORAL | Status: DC
  Administered 2020-11-14 – 2020-11-18 (×5): 1 via ORAL
  Filled 2020-11-14 (×5): qty 1

## 2020-11-14 NOTE — Progress Notes (Signed)
PROGRESS NOTE  [name] YCX:[id number] DOB: 1986-01-04 DOA: 11/12/2020 PCP: Oneita Hurt, No  Brief History    [name] is a 35 y.o. male with medical history significant for CAD status post PCI with DES to the proximal RCA in September 2022, tobacco abuse, hypertension, hyperlipidemia, who presents the emergency department via private vehicle from home for chief concerns of new onset shortness of breath and left-sided chest pain that started evening of 11/11/2020.   He noticed the chest pain like 'i just pulled a muscle' while he was walking in [place] and holding his son (19 months). This was approximately 11/11:30 AM.  Patient presumed he had pulled a muscle as he states, "my son is chunky" therefore he did not present to the ED for further evaluation.   The symptoms progressively worsened, notably around 7 pm.  Patient reports that the chest discomfort and shortness of breath became unbearable, with inhalation caused worsening shortness of breath.  He reports that sitting still will minimally improve his symptoms.   The patient was found to have bilateral pulmonary emboli. He has been started on Eliquis. He continues to have severe chest pain.  Echocardiogram does not demonstrate right heart strain.  The patient continues to have shortness of breath and pleuritic chest pain. He is receiving Remdesevir beginning today.  Hospitalization from 10/23/2020 to 10/26/2020: Patient came to the ED by private vehicle with noted chief concerns of chest pain.  His spouse reported that patient was passing out in the car.  On arrival to the emergency department patient was noted to be pulseless and CPR was initiated.  Patient was resuscitated he underwent cardiac catheterization and was noted to have occluded RCA.  Patient was also intubated and mechanically ventilated.  He is status post extubation on 10/24/2020.  He was noted to also have aspiration pneumonia likely secondary to vomiting episode prior to  intubation.  He was noted to have gastric contents in airway during intubation.  Patient was started on Unasyn and subsequently transition to Augmentin.  He was also on amiodarone drip and this was discontinued on 10/25/2020.   He was also noted to have elevated liver enzymes which was presumed reactive to underlying hemodynamic instability during resuscitation.  Right upper quadrant ultrasound was performed and did not show any biliary ductal dilatation or stones.  Overall LFTs were improving prior to patient discharge.   Patient noted to have new neurological deficits, specifically he had decreased sensation in his right lower extremity as well as blurry visions.  Neurology was consulted.  MRI of the brain was performed and found to be unremarkable.  Neurology, Dr. Cena Benton at the time recommended further outpatient work-up as appropriate.   A work-up for Raynauds disease was performed as well and ANA, factor V Leiden were ordered and found to be negative.   Patient was discharged with a LifeVest.   LHC 10/23/2020: Patient had a left heart cath with impressions reading inferior STEMI and status post successful primary PCI with DES to ostial/proximal RCA; preserved left ventricular function, with estimated LV ejection fraction 30%. - Recommendations at this cath was: Dual antiplatelet and uninterrupted for 1 year, high intensity atorvastatin, 2D echocardiogram   2D echocardiogram on 10/25/2020 was read as LVEF of 40 to 45%:   Consultants  None  Procedures  None  Antibiotics  None  Subjective  The patient is resting. He continues to complain of the feeling of dyspnea and pleuritic chest pain.   Objective   Vitals:  Vitals:   11/14/20 0808 11/14/20 1202  BP: 120/88 115/82  Pulse: 95 86  Resp: 19 19  Temp: 98.5 F (36.9 C) 98.6 F (37 C)  SpO2: 94% 94%   Exam:  Constitutional:  Awake, alert, and oriented x 3. Mild distress from shortness of breath and chest pain. Respiratory:   CTA bilaterally, no w/r/r.  Respiratory effort normal. No retractions or accessory muscle use Cardiovascular:  RRR, no m/r/g No LE extremity edema   Normal pedal pulses Abdomen:  Abdomen appears normal; no tenderness or masses No hernias No HSM Musculoskeletal:  Digits/nails BUE: no clubbing, cyanosis, petechiae, infection exam of joints, bones, muscles of at least one of following: head/neck, RUE, LUE, RLE, LLE   Skin:  No rashes, lesions, ulcers palpation of skin: no induration or nodules Neurologic:  CN 2-12 intact Sensation all 4 extremities intact Psychiatric:  Mental status Mood, affect appropriate Orientation to person, place, time  judgment and insight appear intact    I have personally reviewed the following:   Today's Data  Vitals  Lab Data  CBC, BMP  Imaging  CTA chest  Cardiology Data  EKG Echocardiogram  Scheduled Meds:  apixaban  10 mg Oral BID   Followed by   [name] ON 11/20/2020] apixaban  5 mg Oral BID   aspirin  81 mg Oral Daily   atorvastatin  80 mg Oral QHS   folic acid  1 mg Oral Daily   lisinopril  5 mg Oral Daily   metoprolol tartrate  25 mg Oral BID   multivitamin with minerals  1 tablet Oral Daily   thiamine  100 mg Oral Daily   ticagrelor  90 mg Oral BID   Continuous Infusions:  [START ON 11/15/2020] remdesivir 100 mg in NS 100 mL      Principal Problem:   Pulmonary embolism (HCC) Active Problems:   STEMI involving oth coronary artery of inferior wall (HCC)   Acute respiratory failure with hypoxia and hypercapnia (HCC)   Transaminitis   Ischemic cardiomyopathy   Leukocytosis   CHF exacerbation (HCC)   LOS: 1 day   A & P   Shortness of breath and chest pain secondary to bilateral pulmonary embolism- - Patient will need outpatient hematology work-up for hypercoagulable state and or carcinoma however given patient is unvaccinated for COVID-19 and his wife was positive for COVID about 1 month ago and patient testing  positive for COVID-19 at this time, his hypercoagulable state is presumed secondary to inflammation from COVID-19 infection  - Recommend PCP and patient to work together on outpatient hematology/oncology work-up - Bilateral lower extremity ultrasound to assess for DVT was ordered - Continue heparin GTT - Vascular, Dr. Wyn Quaker has been consulted via secure chat - Complete echo has been ordered to assess for right heart strain in setting of bilateral pulmonary embolism - Symptomatic support: Morphine 2 mg IV every 3 hours as needed for moderate pain, 4 doses has been ordered - Admit to progressive cardiac, observation, telemetry   # Ischemic cardiomyopathy # CAD # History of STEMI in September 2022 - Status post PCI with 3.0 x 18 mm frontier DES to proximal RCA - Patient was discharged on aspirin 81 mg daily, Brilinta  - Patient has been compliant with aspirin, Brilinta, metoprolol, lisinopril and has taken his a.m. dosing medications prior to presentation - Cardiology outpatient appointment on 11/04/2020 recommended the patient start Plavix 75 mg daily with 300 mg loading dose on day 1 after completion of Brilinta prescription -  Metoprolol tartrate 25 mg twice daily resumed  # Hypertension-takes lisinopril 5 mg daily, this has been resumed for 11/13/2020   # Left upper lobe pulmonary 3 mm solid nodule that is read as on CT - Patient will need outpatient follow-up.  - I discussed the results of this 3 mm nodule with patient at bedside and with spouse, Mrs. Chaput over the phone extensively - I extensively discussed with spouse, Mrs. Capelli the patient will need to have regular follow-up with the PCP, a repeat CT of the chest in approximately 12 months, and a referral for hematology and oncology outpatient.  Both spouse and patient endorses understanding and compliance   # History of transaminitis-presumed secondary to hemodynamic instability on September 2022 presentation   # Hyperlipidemia -  Check LFT and if elevated, we will discontinue atorvastatin  - Atorvastatin 80 mg nightly has been ordered for 11/12/2020    # Right lower extremity neuropathy-patient has outpatient consultation appointment with neurologist on January 08, 2021   # Blurry vision-persistent - Patient to keep outpatient appointment with opthalmology   # History of tobacco abuse-patient quit smoking after STEMI   # History of alcohol use-patient quit smoking after STEMI   # COVID infection -continue airborne and contact precautions - At this time patient is not requiring SPO2 supplementation, no indications for steroids at this time   I have seen and examined this patient myself. I have spent 32 minutes in his evaluation and care.  DVT prophylaxis: Eliquis Code Status: Full code Diet: N.p.o. except for sips with meds Family Communication: Spouse available at bedside  Michael Hopes, DO [facility] Direct contact: see [email or web address]  7PM-7AM contact night coverage as above 11/14/2020, 3:53 PM  LOS: 0 days

## 2020-11-14 NOTE — Consult Note (Signed)
Remdesivir - Pharmacy Brief Note   O:  ALT: 71 CXR: N/A SpO2: 94% on RA   A/P:  Remdesivir 200 mg IVPB once followed by 100 mg IVPB daily x 4 days.   Bettey Costa, PharmD Clinical Pharmacist 11/14/2020 1:27 PM

## 2020-11-15 LAB — COMPREHENSIVE METABOLIC PANEL
ALT: 61 U/L — ABNORMAL HIGH (ref 0–44)
AST: 29 U/L (ref 15–41)
Albumin: 3.5 g/dL (ref 3.5–5.0)
Alkaline Phosphatase: 61 U/L (ref 38–126)
Anion gap: 6 (ref 5–15)
BUN: 13 mg/dL (ref 6–20)
CO2: 28 mmol/L (ref 22–32)
Calcium: 9.1 mg/dL (ref 8.9–10.3)
Chloride: 99 mmol/L (ref 98–111)
Creatinine, Ser: 0.93 mg/dL (ref 0.61–1.24)
GFR, Estimated: >60 mL/min (ref >60)
Glucose, Bld: 103 mg/dL — ABNORMAL HIGH (ref 70–99)
Potassium: 4.4 mmol/L (ref 3.5–5.1)
Sodium: 133 mmol/L — ABNORMAL LOW (ref 135–145)
Total Bilirubin: 1.3 mg/dL — ABNORMAL HIGH (ref 0.3–1.2)
Total Protein: 7.2 g/dL (ref 6.5–8.1)

## 2020-11-15 LAB — FERRITIN: Ferritin: 391 ng/mL — ABNORMAL HIGH (ref 24–336)

## 2020-11-15 LAB — CBC WITH DIFFERENTIAL/PLATELET
Abs Immature Granulocytes: 0.07 10*3/uL (ref 0.00–0.07)
Basophils Absolute: 0.1 10*3/uL (ref 0.0–0.1)
Basophils Relative: 0 %
Eosinophils Absolute: 0.5 10*3/uL (ref 0.0–0.5)
Eosinophils Relative: 3 %
HCT: 37.1 % — ABNORMAL LOW (ref 39.0–52.0)
Hemoglobin: 12.9 g/dL — ABNORMAL LOW (ref 13.0–17.0)
Immature Granulocytes: 1 %
Lymphocytes Relative: 15 %
Lymphs Abs: 2.3 10*3/uL (ref 0.7–4.0)
MCH: 30.8 pg (ref 26.0–34.0)
MCHC: 34.8 g/dL (ref 30.0–36.0)
MCV: 88.5 fL (ref 80.0–100.0)
Monocytes Absolute: 2 10*3/uL — ABNORMAL HIGH (ref 0.1–1.0)
Monocytes Relative: 13 %
Neutro Abs: 10.1 10*3/uL — ABNORMAL HIGH (ref 1.7–7.7)
Neutrophils Relative %: 68 %
Platelets: 223 10*3/uL (ref 150–400)
RBC: 4.19 MIL/uL — ABNORMAL LOW (ref 4.22–5.81)
RDW: 12.9 % (ref 11.5–15.5)
WBC: 15 10*3/uL — ABNORMAL HIGH (ref 4.0–10.5)
nRBC: 0 % (ref 0.0–0.2)

## 2020-11-15 LAB — MAGNESIUM: Magnesium: 1.9 mg/dL (ref 1.7–2.4)

## 2020-11-15 LAB — C-REACTIVE PROTEIN: CRP: 23.5 mg/dL — ABNORMAL HIGH (ref <1.0)

## 2020-11-15 LAB — PHOSPHORUS: Phosphorus: 3.3 mg/dL (ref 2.5–4.6)

## 2020-11-15 NOTE — Progress Notes (Signed)
PROGRESS NOTE  [name]:[id number] DOB: [birth year] DOA: 11/12/2020 PCP: Oneita Hurt, No  Brief History    [name] is a 35 y.o. male with medical history significant for CAD status post PCI with DES to the proximal RCA in September 2022, tobacco abuse, hypertension, hyperlipidemia, who presents the emergency department via private vehicle from home for chief concerns of new onset shortness of breath and left-sided chest pain that started evening of 11/11/2020.   He noticed the chest pain like 'i just pulled a muscle' while he was walking in [place] and holding his son (19 months). This was approximately 11/11:30 AM.  Patient presumed he had pulled a muscle as he states, "my son is chunky" therefore he did not present to the ED for further evaluation.   The symptoms progressively worsened, notably around 7 pm.  Patient reports that the chest discomfort and shortness of breath became unbearable, with inhalation caused worsening shortness of breath.  He reports that sitting still will minimally improve his symptoms.   The patient was found to have bilateral pulmonary emboli. He has been started on Eliquis. He continues to have severe chest pain.  Echocardiogram does not demonstrate right heart strain.  The patient continues to have shortness of breath and pleuritic chest pain. He is receiving Remdesevir beginning today.  As of 11/15/2020, he is stating that his breathing is slowly improving.   Hospitalization from 10/23/2020 to 10/26/2020: Patient came to the ED by private vehicle with noted chief concerns of chest pain.  His spouse reported that patient was passing out in the car.  On arrival to the emergency department patient was noted to be pulseless and CPR was initiated.  Patient was resuscitated he underwent cardiac catheterization and was noted to have occluded RCA.  Patient was also intubated and mechanically ventilated.  He is status post extubation on 10/24/2020.  He was noted to also  have aspiration pneumonia likely secondary to vomiting episode prior to intubation.  He was noted to have gastric contents in airway during intubation.  Patient was started on Unasyn and subsequently transition to Augmentin.  He was also on amiodarone drip and this was discontinued on 10/25/2020.   He was also noted to have elevated liver enzymes which was presumed reactive to underlying hemodynamic instability during resuscitation.  Right upper quadrant ultrasound was performed and did not show any biliary ductal dilatation or stones.  Overall LFTs were improving prior to patient discharge.   Patient noted to have new neurological deficits, specifically he had decreased sensation in his right lower extremity as well as blurry visions.  Neurology was consulted.  MRI of the brain was performed and found to be unremarkable.  Neurology, Dr. Cena Benton at the time recommended further outpatient work-up as appropriate.   A work-up for Raynauds disease was performed as well and ANA, factor V Leiden were ordered and found to be negative.   Patient was discharged with a LifeVest.   LHC 10/23/2020: Patient had a left heart cath with impressions reading inferior STEMI and status post successful primary PCI with DES to ostial/proximal RCA; preserved left ventricular function, with estimated LV ejection fraction 30%. - Recommendations at this cath was: Dual antiplatelet and uninterrupted for 1 year, high intensity atorvastatin, 2D echocardiogram   2D echocardiogram on 10/25/2020 was read as LVEF of 40 to 45%:   Consultants  None  Procedures  None  Antibiotics  None  Subjective  The patient is resting. He states that he is feeling  a little better every day. No new complaints.   Objective   Vitals:  Vitals:   11/15/20 0810 11/15/20 1238  BP: 110/71 120/73  Pulse: 97 85  Resp: 18 20  Temp: 98.3 F (36.8 C) 98.4 F (36.9 C)  SpO2: 93% 93%   Exam:  Constitutional:  Awake, alert, and oriented x 3.  No acute distress. Respiratory:  CTA bilaterally, no w/r/r.  Respiratory effort normal. No retractions or accessory muscle use Cardiovascular:  RRR, no m/r/g No LE extremity edema   Normal pedal pulses Abdomen:  Abdomen appears normal; no tenderness or masses No hernias No HSM Musculoskeletal:  Digits/nails BUE: no clubbing, cyanosis, petechiae, infection exam of joints, bones, muscles of at least one of following: head/neck, RUE, LUE, RLE, LLE   Skin:  No rashes, lesions, ulcers palpation of skin: no induration or nodules Neurologic:  CN 2-12 intact Sensation all 4 extremities intact Psychiatric:  Mental status Mood, affect appropriate Orientation to person, place, time  judgment and insight appear intact    I have personally reviewed the following:   Today's Data  Vitals  Lab Data  CBC, BMP  Imaging  CTA chest  Cardiology Data  EKG Echocardiogram  Scheduled Meds:  apixaban  10 mg Oral BID   Followed by   [name] ON 11/20/2020] apixaban  5 mg Oral BID   aspirin  81 mg Oral Daily   atorvastatin  80 mg Oral QHS   folic acid  1 mg Oral Daily   lisinopril  5 mg Oral Daily   metoprolol tartrate  25 mg Oral BID   multivitamin with minerals  1 tablet Oral Daily   thiamine  100 mg Oral Daily   ticagrelor  90 mg Oral BID   Continuous Infusions:  remdesivir 100 mg in NS 100 mL Stopped (11/15/20 1016)    Principal Problem:   Pulmonary embolism (HCC) Active Problems:   STEMI involving oth coronary artery of inferior wall (HCC)   Acute respiratory failure with hypoxia and hypercapnia (HCC)   Transaminitis   Ischemic cardiomyopathy   Leukocytosis   CHF exacerbation (HCC)   LOS: 2 days   A & P   Shortness of breath and chest pain secondary to bilateral pulmonary embolism due to COVID-19. -Remdesevir started on 11/14/2020.  Patient will need outpatient hematology work-up for hypercoagulable state and or carcinoma however given patient is unvaccinated for  COVID-19 and his wife was positive for COVID about 1 month ago and patient testing positive for COVID-19 at this time, his hypercoagulable state is presumed secondary to inflammation from COVID-19 infection  - Recommend PCP and patient to work together on outpatient hematology/oncology work-up - Bilateral lower extremity ultrasound to assess for DVT was ordered and ws negative - Continue heparin GTT - Vascular, Dr. Wyn Quaker has been consulted via secure chat - Echocardiogram demonstrates no right heart strain. - Symptomatic support: Pain control with oxycodone. - Admit to progressive cardiac, observation, telemetry - patient is slowly improving   # Ischemic cardiomyopathy # CAD # History of STEMI in September 2022 - Status post PCI with 3.0 x 18 mm frontier DES to proximal RCA - Patient was discharged on aspirin 81 mg daily, Brilinta  - Patient has been compliant with aspirin, Brilinta, metoprolol, lisinopril and has taken his a.m. dosing medications prior to presentation - Cardiology outpatient appointment on 11/04/2020 recommended the patient start Plavix 75 mg daily with 300 mg loading dose on day 1 after completion of Brilinta prescription - Metoprolol  tartrate 25 mg twice daily resumed - No bleeding complications this admission.   # Hypertension-The patient is normotensive on lisinopril 5 mg daily.   # Left upper lobe pulmonary 3 mm solid nodule that is read as on CT - Patient will need outpatient follow-up.  - I discussed the results of this 3 mm nodule with patient at bedside and with spouse, Mrs. Acree over the phone extensively - I extensively discussed with spouse, Mrs. Doolan the patient will need to have regular follow-up with the PCP, a repeat CT of the chest in approximately 12 months, and a referral for hematology and oncology outpatient.  Both spouse and patient endorses understanding and compliance   # History of transaminitis-presumed secondary to hemodynamic instability on  September 2022 presentation. Monitor.   # Hyperlipidemia - Check LFT and if elevated, we will discontinue atorvastatin  - Atorvastatin 80 mg nightly has been ordered for 11/12/2020 . Continue.    # Right lower extremity neuropathy-patient has outpatient consultation appointment with neurologist on January 08, 2021.   # Blurry vision-persistent - Patient to keep outpatient appointment with opthalmology   # History of tobacco abuse-patient quit smoking after STEMI   # History of alcohol use-patient quit smoking after STEMI   # COVID infection -continue airborne and contact precautions - The patient is on day 2 of 5 for remdesevir.    I have seen and examined this patient myself. I have spent 32 minutes in his evaluation and care.  DVT prophylaxis: Eliquis Code Status: Full code Diet: N.p.o. except for sips with meds Family Communication: Spouse available at bedside  Faheem Ziemann, DO [facility] Direct contact: see [email or web address]  7PM-7AM contact night coverage as above 11/15/2020, 4:11 PM  LOS: 0 days

## 2020-11-16 LAB — COMPREHENSIVE METABOLIC PANEL
ALT: 62 U/L — ABNORMAL HIGH (ref 0–44)
AST: 37 U/L (ref 15–41)
Albumin: 3.4 g/dL — ABNORMAL LOW (ref 3.5–5.0)
Alkaline Phosphatase: 80 U/L (ref 38–126)
Anion gap: 9 (ref 5–15)
BUN: 16 mg/dL (ref 6–20)
CO2: 26 mmol/L (ref 22–32)
Calcium: 9 mg/dL (ref 8.9–10.3)
Chloride: 93 mmol/L — ABNORMAL LOW (ref 98–111)
Creatinine, Ser: 1.02 mg/dL (ref 0.61–1.24)
GFR, Estimated: >60 mL/min (ref >60)
Glucose, Bld: 98 mg/dL (ref 70–99)
Potassium: 4 mmol/L (ref 3.5–5.1)
Sodium: 128 mmol/L — ABNORMAL LOW (ref 135–145)
Total Bilirubin: 1.1 mg/dL (ref 0.3–1.2)
Total Protein: 7.2 g/dL (ref 6.5–8.1)

## 2020-11-16 LAB — CBC WITH DIFFERENTIAL/PLATELET
Abs Immature Granulocytes: 0.1 10*3/uL — ABNORMAL HIGH (ref 0.00–0.07)
Basophils Absolute: 0.1 10*3/uL (ref 0.0–0.1)
Basophils Relative: 1 %
Eosinophils Absolute: 0.6 10*3/uL — ABNORMAL HIGH (ref 0.0–0.5)
Eosinophils Relative: 4 %
HCT: 32.5 % — ABNORMAL LOW (ref 39.0–52.0)
Hemoglobin: 11.7 g/dL — ABNORMAL LOW (ref 13.0–17.0)
Immature Granulocytes: 1 %
Lymphocytes Relative: 17 %
Lymphs Abs: 2.5 10*3/uL (ref 0.7–4.0)
MCH: 31.6 pg (ref 26.0–34.0)
MCHC: 36 g/dL (ref 30.0–36.0)
MCV: 87.8 fL (ref 80.0–100.0)
Monocytes Absolute: 2.3 10*3/uL — ABNORMAL HIGH (ref 0.1–1.0)
Monocytes Relative: 16 %
Neutro Abs: 9.1 10*3/uL — ABNORMAL HIGH (ref 1.7–7.7)
Neutrophils Relative %: 61 %
Platelets: 221 10*3/uL (ref 150–400)
RBC: 3.7 MIL/uL — ABNORMAL LOW (ref 4.22–5.81)
RDW: 13 % (ref 11.5–15.5)
WBC: 14.7 10*3/uL — ABNORMAL HIGH (ref 4.0–10.5)
nRBC: 0 % (ref 0.0–0.2)

## 2020-11-16 LAB — PHOSPHORUS: Phosphorus: 4 mg/dL (ref 2.5–4.6)

## 2020-11-16 LAB — C-REACTIVE PROTEIN: CRP: 25.8 mg/dL — ABNORMAL HIGH (ref <1.0)

## 2020-11-16 LAB — MAGNESIUM: Magnesium: 2 mg/dL (ref 1.7–2.4)

## 2020-11-16 LAB — FERRITIN: Ferritin: 642 ng/mL — ABNORMAL HIGH (ref 24–336)

## 2020-11-16 NOTE — Progress Notes (Signed)
PROGRESS NOTE  [name] PNT:[id number] DOB: [birth year] DOA: 11/12/2020 PCP: Oneita Hurt, No  Brief History    [name] is a 35 y.o. male with medical history significant for CAD status post PCI with DES to the proximal RCA in September 2022, tobacco abuse, hypertension, hyperlipidemia, who presents the emergency department via private vehicle from home for chief concerns of new onset shortness of breath and left-sided chest pain that started evening of 11/11/2020.   He noticed the chest pain like 'i just pulled a muscle' while he was walking in [place] and holding his son (19 months). This was approximately 11/11:30 AM.  Patient presumed he had pulled a muscle as he states, "my son is chunky" therefore he did not present to the ED for further evaluation.   The symptoms progressively worsened, notably around 7 pm.  Patient reports that the chest discomfort and shortness of breath became unbearable, with inhalation caused worsening shortness of breath.  He reports that sitting still will minimally improve his symptoms.   The patient was found to have bilateral pulmonary emboli. He has been started on Eliquis. He continues to have severe chest pain.  Echocardiogram does not demonstrate right heart strain.  The patient continues to have shortness of breath and pleuritic chest pain. He is receiving Remdesevir and is on day 3/5.  As of 11/16/2020, he is stating that his breathing is slowly improving.   Hospitalization from 10/23/2020 to 10/26/2020: Patient came to the ED by private vehicle with noted chief concerns of chest pain.  His spouse reported that patient was passing out in the car.  On arrival to the emergency department patient was noted to be pulseless and CPR was initiated.  Patient was resuscitated he underwent cardiac catheterization and was noted to have occluded RCA.  Patient was also intubated and mechanically ventilated.  He is status post extubation on 10/24/2020.  He was noted to  also have aspiration pneumonia likely secondary to vomiting episode prior to intubation.  He was noted to have gastric contents in airway during intubation.  Patient was started on Unasyn and subsequently transition to Augmentin.  He was also on amiodarone drip and this was discontinued on 10/25/2020.   He was also noted to have elevated liver enzymes which was presumed reactive to underlying hemodynamic instability during resuscitation.  Right upper quadrant ultrasound was performed and did not show any biliary ductal dilatation or stones.  Overall LFTs were improving prior to patient discharge.   Patient noted to have new neurological deficits, specifically he had decreased sensation in his right lower extremity as well as blurry visions.  Neurology was consulted.  MRI of the brain was performed and found to be unremarkable.  Neurology, Dr. Cena Benton at the time recommended further outpatient work-up as appropriate.   A work-up for Raynauds disease was performed as well and ANA, factor V Leiden were ordered and found to be negative.   Patient was discharged with a LifeVest.   LHC 10/23/2020: Patient had a left heart cath with impressions reading inferior STEMI and status post successful primary PCI with DES to ostial/proximal RCA; preserved left ventricular function, with estimated LV ejection fraction 30%. - Recommendations at this cath was: Dual antiplatelet and uninterrupted for 1 year, high intensity atorvastatin, 2D echocardiogram   2D echocardiogram on 10/25/2020 was read as LVEF of 40 to 45%:   Consultants  None  Procedures  None  Antibiotics  None  Subjective  The patient is resting. He states that  he is feeling a little better every day. No new complaints.   Objective   Vitals:  Vitals:   11/16/20 0841 11/16/20 1202  BP: 116/74 112/73  Pulse:  85  Resp: 20 18  Temp: 98.1 F (36.7 C) 98.1 F (36.7 C)  SpO2: 94% 91%   Exam:  Constitutional:  Awake, alert, and oriented x  3. No acute distress. Respiratory:  CTA bilaterally, no w/r/r.  Respiratory effort normal. No retractions or accessory muscle use Cardiovascular:  RRR, no m/r/g No LE extremity edema   Normal pedal pulses Abdomen:  Abdomen appears normal; no tenderness or masses No hernias No HSM Musculoskeletal:  Digits/nails BUE: no clubbing, cyanosis, petechiae, infection exam of joints, bones, muscles of at least one of following: head/neck, RUE, LUE, RLE, LLE   Skin:  No rashes, lesions, ulcers palpation of skin: no induration or nodules Neurologic:  CN 2-12 intact Sensation all 4 extremities intact Psychiatric:  Mental status Mood, affect appropriate Orientation to person, place, time  judgment and insight appear intact    I have personally reviewed the following:   Today's Data  Vitals  Lab Data  CBC, BMP  Imaging  CTA chest  Cardiology Data  EKG Echocardiogram  Scheduled Meds:  apixaban  10 mg Oral BID   Followed by   [name] ON 11/20/2020] apixaban  5 mg Oral BID   aspirin  81 mg Oral Daily   atorvastatin  80 mg Oral QHS   folic acid  1 mg Oral Daily   lisinopril  5 mg Oral Daily   metoprolol tartrate  25 mg Oral BID   multivitamin with minerals  1 tablet Oral Daily   thiamine  100 mg Oral Daily   ticagrelor  90 mg Oral BID   Continuous Infusions:  remdesivir 100 mg in NS 100 mL Stopped (11/16/20 0855)    Principal Problem:   Pulmonary embolism (HCC) Active Problems:   STEMI involving oth coronary artery of inferior wall (HCC)   Acute respiratory failure with hypoxia and hypercapnia (HCC)   Transaminitis   Ischemic cardiomyopathy   Leukocytosis   CHF exacerbation (HCC)   LOS: 3 days   A & P   Shortness of breath and chest pain secondary to bilateral pulmonary embolism due to COVID-19. -Remdesevir started on 11/14/2020.  Patient will need outpatient hematology work-up for hypercoagulable state and or carcinoma however given patient is unvaccinated for  COVID-19 and his wife was positive for COVID about 1 month ago and patient testing positive for COVID-19 at this time, his hypercoagulable state is presumed secondary to inflammation from COVID-19 infection  - Recommend PCP and patient to work together on outpatient hematology/oncology work-up - Bilateral lower extremity ultrasound to assess for DVT was ordered and ws negative - Continue heparin GTT - Vascular, Dr. Wyn Quaker has been consulted via secure chat - Echocardiogram demonstrates no right heart strain. - Symptomatic support: Pain control with oxycodone. - Admit to progressive cardiac, observation, telemetry - patient is slowly improving - The patient is on day 3/5 of remdesevir.   # Ischemic cardiomyopathy # CAD # History of STEMI in September 2022 - Status post PCI with 3.0 x 18 mm frontier DES to proximal RCA - Patient was discharged on aspirin 81 mg daily, Brilinta  - Patient has been compliant with aspirin, Brilinta, metoprolol, lisinopril and has taken his a.m. dosing medications prior to presentation - Cardiology outpatient appointment on 11/04/2020 recommended the patient start Plavix 75 mg daily with 300 mg  loading dose on day 1 after completion of Brilinta prescription - Metoprolol tartrate 25 mg twice daily resumed - No bleeding complications this admission.   # Hypertension-The patient is normotensive on lisinopril 5 mg daily.   # Left upper lobe pulmonary 3 mm solid nodule that is read as on CT - Patient will need outpatient follow-up.  - I discussed the results of this 3 mm nodule with patient at bedside and with spouse, Mrs. Frampton over the phone extensively - I extensively discussed with spouse, Mrs. Rendell the patient will need to have regular follow-up with the PCP, a repeat CT of the chest in approximately 12 months, and a referral for hematology and oncology outpatient.  Both spouse and patient endorses understanding and compliance   # History of  transaminitis-presumed secondary to hemodynamic instability on September 2022 presentation. Monitor.   # Hyperlipidemia - Check LFT and if elevated, we will discontinue atorvastatin  - Atorvastatin 80 mg nightly has been ordered for 11/12/2020 . Continue.    # Right lower extremity neuropathy-patient has outpatient consultation appointment with neurologist on January 08, 2021.   # Blurry vision-persistent - Patient to keep outpatient appointment with opthalmology   # History of tobacco abuse-patient quit smoking after STEMI   # History of alcohol use-patient quit smoking after STEMI   # COVID infection -continue airborne and contact precautions - The patient is on day 3 of 5 for remdesevir. The patient states that he lives remotely from here and cannot come back to complete his therapy with outpatient infusion.   I have seen and examined this patient myself. I have spent 34 minutes in his evaluation and care.  DVT prophylaxis: Eliquis Code Status: Full code Diet: N.p.o. except for sips with meds Family Communication: Spouse available at bedside  Mareo Portilla, DO [facility] Direct contact: see [email or web address]  7PM-7AM contact night coverage as above 11/16/2020, 2:50 PM  LOS: 0 days

## 2020-11-17 ENCOUNTER — Telehealth: Payer: Self-pay | Admitting: Gerontology

## 2020-11-17 LAB — BLOOD GAS, ARTERIAL
Acid-base deficit: 23.2 mmol/L — ABNORMAL HIGH (ref 0.0–2.0)
Bicarbonate: 10 mmol/L — ABNORMAL LOW (ref 20.0–28.0)
FIO2: 100
MECHVT: 550 mL
O2 Saturation: 77.7 %
PEEP: 10 cmH2O
Patient temperature: 37
RATE: 16 resp/min
pCO2 arterial: 51 mmHg — ABNORMAL HIGH (ref 32.0–48.0)
pH, Arterial: 6.9 — CL (ref 7.350–7.450)
pO2, Arterial: 72 mmHg — ABNORMAL LOW (ref 83.0–108.0)

## 2020-11-17 LAB — COMPREHENSIVE METABOLIC PANEL
ALT: 54 U/L — ABNORMAL HIGH (ref 0–44)
AST: 31 U/L (ref 15–41)
Albumin: 3.5 g/dL (ref 3.5–5.0)
Alkaline Phosphatase: 94 U/L (ref 38–126)
Anion gap: 8 (ref 5–15)
BUN: 18 mg/dL (ref 6–20)
CO2: 26 mmol/L (ref 22–32)
Calcium: 9 mg/dL (ref 8.9–10.3)
Chloride: 97 mmol/L — ABNORMAL LOW (ref 98–111)
Creatinine, Ser: 1.05 mg/dL (ref 0.61–1.24)
GFR, Estimated: >60 mL/min (ref >60)
Glucose, Bld: 102 mg/dL — ABNORMAL HIGH (ref 70–99)
Potassium: 4.3 mmol/L (ref 3.5–5.1)
Sodium: 131 mmol/L — ABNORMAL LOW (ref 135–145)
Total Bilirubin: 1 mg/dL (ref 0.3–1.2)
Total Protein: 7.2 g/dL (ref 6.5–8.1)

## 2020-11-17 LAB — C-REACTIVE PROTEIN: CRP: 26.5 mg/dL — ABNORMAL HIGH (ref <1.0)

## 2020-11-17 LAB — CBC WITH DIFFERENTIAL/PLATELET
Abs Immature Granulocytes: 0.07 10*3/uL (ref 0.00–0.07)
Basophils Absolute: 0.1 10*3/uL (ref 0.0–0.1)
Basophils Relative: 0 %
Eosinophils Absolute: 0.7 10*3/uL — ABNORMAL HIGH (ref 0.0–0.5)
Eosinophils Relative: 5 %
HCT: 32.7 % — ABNORMAL LOW (ref 39.0–52.0)
Hemoglobin: 11.7 g/dL — ABNORMAL LOW (ref 13.0–17.0)
Immature Granulocytes: 1 %
Lymphocytes Relative: 16 %
Lymphs Abs: 2 10*3/uL (ref 0.7–4.0)
MCH: 31.1 pg (ref 26.0–34.0)
MCHC: 35.8 g/dL (ref 30.0–36.0)
MCV: 87 fL (ref 80.0–100.0)
Monocytes Absolute: 1.7 10*3/uL — ABNORMAL HIGH (ref 0.1–1.0)
Monocytes Relative: 13 %
Neutro Abs: 8.5 10*3/uL — ABNORMAL HIGH (ref 1.7–7.7)
Neutrophils Relative %: 65 %
Platelets: 243 10*3/uL (ref 150–400)
RBC: 3.76 MIL/uL — ABNORMAL LOW (ref 4.22–5.81)
RDW: 13.1 % (ref 11.5–15.5)
WBC: 12.9 10*3/uL — ABNORMAL HIGH (ref 4.0–10.5)
nRBC: 0 % (ref 0.0–0.2)

## 2020-11-17 LAB — PHOSPHORUS: Phosphorus: 4.4 mg/dL (ref 2.5–4.6)

## 2020-11-17 LAB — FERRITIN: Ferritin: 610 ng/mL — ABNORMAL HIGH (ref 24–336)

## 2020-11-17 LAB — MAGNESIUM: Magnesium: 2 mg/dL (ref 1.7–2.4)

## 2020-11-17 NOTE — Progress Notes (Signed)
PROGRESS NOTE  [name] MHD:[id number] DOB: 1985-07-21 DOA: 11/12/2020 PCP: Oneita Hurt, No  Brief History    [name] is a 35 y.o. male with medical history significant for CAD status post PCI with DES to the proximal RCA in September 2022, tobacco abuse, hypertension, hyperlipidemia, who presents the emergency department via private vehicle from home for chief concerns of new onset shortness of breath and left-sided chest pain that started evening of 11/11/2020.   He noticed the chest pain like 'i just pulled a muscle' while he was walking in [place] and holding his son (19 months). This was approximately 11/11:30 AM.  Patient presumed he had pulled a muscle as he states, "my son is chunky" therefore he did not present to the ED for further evaluation.   The symptoms progressively worsened, notably around 7 pm.  Patient reports that the chest discomfort and shortness of breath became unbearable, with inhalation caused worsening shortness of breath.  He reports that sitting still will minimally improve his symptoms.   The patient was found to have bilateral pulmonary emboli. He has been started on Eliquis. He continues to have severe chest pain.  Echocardiogram does not demonstrate right heart strain.  11/17/2020 is day 4/5 of remdesevir.   Hospitalization from 10/23/2020 to 10/26/2020: Patient came to the ED by private vehicle with noted chief concerns of chest pain.  His spouse reported that patient was passing out in the car.  On arrival to the emergency department patient was noted to be pulseless and CPR was initiated.  Patient was resuscitated he underwent cardiac catheterization and was noted to have occluded RCA.  Patient was also intubated and mechanically ventilated.  He is status post extubation on 10/24/2020.  He was noted to also have aspiration pneumonia likely secondary to vomiting episode prior to intubation.  He was noted to have gastric contents in airway during intubation.   Patient was started on Unasyn and subsequently transition to Augmentin.  He was also on amiodarone drip and this was discontinued on 10/25/2020.   He was also noted to have elevated liver enzymes which was presumed reactive to underlying hemodynamic instability during resuscitation.  Right upper quadrant ultrasound was performed and did not show any biliary ductal dilatation or stones.  Overall LFTs were improving prior to patient discharge.   Patient noted to have new neurological deficits, specifically he had decreased sensation in his right lower extremity as well as blurry visions.  Neurology was consulted.  MRI of the brain was performed and found to be unremarkable.  Neurology, Dr. Cena Benton at the time recommended further outpatient work-up as appropriate.   A work-up for Raynauds disease was performed as well and ANA, factor V Leiden were ordered and found to be negative.   Patient was discharged with a LifeVest.   LHC 10/23/2020: Patient had a left heart cath with impressions reading inferior STEMI and status post successful primary PCI with DES to ostial/proximal RCA; preserved left ventricular function, with estimated LV ejection fraction 30%. - Recommendations at this cath was: Dual antiplatelet and uninterrupted for 1 year, high intensity atorvastatin, 2D echocardiogram   2D echocardiogram on 10/25/2020 was read as LVEF of 40 to 45%:   Consultants  None  Procedures  None  Antibiotics  None  Subjective  The patient is resting. No new complaints.   Objective   Vitals:  Vitals:   11/17/20 0645 11/17/20 0700  BP:  114/67  Pulse:  87  Resp:  18  Temp:  98.3 F (36.8 C) 98 F (36.7 C)  SpO2:  93%   Exam:  Constitutional:  Awake, alert, and oriented x 3. No acute distress. Respiratory:  CTA bilaterally, no w/r/r.  Respiratory effort normal. No retractions or accessory muscle use Cardiovascular:  RRR, no m/r/g No LE extremity edema   Normal pedal pulses Abdomen:   Abdomen appears normal; no tenderness or masses No hernias No HSM Musculoskeletal:  Digits/nails BUE: no clubbing, cyanosis, petechiae, infection exam of joints, bones, muscles of at least one of following: head/neck, RUE, LUE, RLE, LLE   Skin:  No rashes, lesions, ulcers palpation of skin: no induration or nodules Neurologic:  CN 2-12 intact Sensation all 4 extremities intact Psychiatric:  Mental status Mood, affect appropriate Orientation to person, place, time  judgment and insight appear intact    I have personally reviewed the following:   Today's Data  Vitals  Lab Data  CBC, CMP  Imaging  CTA chest  Cardiology Data  EKG Echocardiogram  Scheduled Meds:  apixaban  10 mg Oral BID   Followed by   [name] ON 11/20/2020] apixaban  5 mg Oral BID   aspirin  81 mg Oral Daily   atorvastatin  80 mg Oral QHS   folic acid  1 mg Oral Daily   lisinopril  5 mg Oral Daily   metoprolol tartrate  25 mg Oral BID   multivitamin with minerals  1 tablet Oral Daily   thiamine  100 mg Oral Daily   ticagrelor  90 mg Oral BID   Continuous Infusions:  remdesivir 100 mg in NS 100 mL 100 mg (11/17/20 1007)    Principal Problem:   Pulmonary embolism (HCC) Active Problems:   STEMI involving oth coronary artery of inferior wall (HCC)   Acute respiratory failure with hypoxia and hypercapnia (HCC)   Transaminitis   Ischemic cardiomyopathy   Leukocytosis   CHF exacerbation (HCC)   LOS: 4 days   A & P   Shortness of breath and chest pain secondary to bilateral pulmonary embolism due to COVID-19. -Remdesevir started on 11/14/2020.  Patient will need outpatient hematology work-up for hypercoagulable state and or carcinoma however given patient is unvaccinated for COVID-19 and his wife was positive for COVID about 1 month ago and patient testing positive for COVID-19 at this time, his hypercoagulable state is presumed secondary to inflammation from COVID-19 infection  - Recommend PCP  and patient to work together on outpatient hematology/oncology work-up - Bilateral lower extremity ultrasound to assess for DVT was ordered and ws negative - Continue heparin GTT - Vascular, Dr. Wyn Quaker has been consulted via secure chat - Echocardiogram demonstrates no right heart strain. - Symptomatic support: Pain control with oxycodone. - Admit to progressive cardiac, observation, telemetry - patient is slowly improving - The patient is on day 4/5 of remdesevir. He lives remotely and is unable to come back to complete his infusions.   # Ischemic cardiomyopathy # CAD # History of STEMI in September 2022 - Status post PCI with 3.0 x 18 mm frontier DES to proximal RCA - Patient was discharged on aspirin 81 mg daily, Brilinta  - Patient has been compliant with aspirin, Brilinta, metoprolol, lisinopril and has taken his a.m. dosing medications prior to presentation - Cardiology outpatient appointment on 11/04/2020 recommended the patient start Plavix 75 mg daily with 300 mg loading dose on day 1 after completion of Brilinta prescription - Metoprolol tartrate 25 mg twice daily resumed - No bleeding complications this admission.   #  Hypertension-The patient is normotensive on lisinopril 5 mg daily.   # Left upper lobe pulmonary 3 mm solid nodule that is read as on CT - Patient will need outpatient follow-up.  - I discussed the results of this 3 mm nodule with patient at bedside and with spouse, Mrs. Minshall over the phone extensively - I extensively discussed with spouse, Mrs. Delbene the patient will need to have regular follow-up with the PCP, a repeat CT of the chest in approximately 12 months, and a referral for hematology and oncology outpatient.  Both spouse and patient endorses understanding and compliance   # History of transaminitis-presumed secondary to hemodynamic instability on September 2022 presentation. Monitor.   # Hyperlipidemia - Check LFT and if elevated, we will discontinue  atorvastatin  - Atorvastatin 80 mg nightly has been ordered for 11/12/2020 . Continue.    # Right lower extremity neuropathy-patient has outpatient consultation appointment with neurologist on January 08, 2021.   # Blurry vision-persistent - Patient to keep outpatient appointment with opthalmology   # History of tobacco abuse-patient quit smoking after STEMI   # History of alcohol use-patient quit smoking after STEMI   # COVID infection -continue airborne and contact precautions - The patient is on day 3 of 5 for remdesevir. The patient states that he lives remotely from here and cannot come back to complete his therapy with outpatient infusion.   I have seen and examined this patient myself. I have spent 32 minutes in his evaluation and care.  DVT prophylaxis: Eliquis Code Status: Full code Diet: N.p.o. except for sips with meds Family Communication: Spouse available at bedside  Altovise Wahler, DO [facility] Direct contact: see [email or web address]  7PM-7AM contact night coverage as above 11/17/2020, 1:46 PM  LOS: 0 days

## 2020-11-17 NOTE — Telephone Encounter (Signed)
-----   Message from Vonte M Hedgebeth sent at 11/04/2020 10:24 AM EDT ----- Please call patient ----- Message ----- From: Cobb, Bridget A, LCSW Sent: 10/27/2020   3:06 PM EDT To: Vonte M Hedgebeth  Please follow up with pt, needs PCP.  Active Problems:   STEMI involving oth coronary artery of inferior wall (HCC)   Acute respiratory failure with hypoxia and hypercapnia (HCC)   Endotracheally intubated   On mechanically assisted ventilation (HCC)   Transaminitis   Shock (HCC)   Cardiac arrest (HCC)  Application provided at bedside.   

## 2020-11-18 ENCOUNTER — Other Ambulatory Visit: Payer: Self-pay

## 2020-11-18 ENCOUNTER — Telehealth: Payer: Self-pay | Admitting: Emergency Medicine

## 2020-11-18 ENCOUNTER — Encounter: Payer: Self-pay | Admitting: Neurology

## 2020-11-18 DIAGNOSIS — E785 Hyperlipidemia, unspecified: Secondary | ICD-10-CM

## 2020-11-18 DIAGNOSIS — I10 Essential (primary) hypertension: Secondary | ICD-10-CM

## 2020-11-18 DIAGNOSIS — U071 COVID-19: Secondary | ICD-10-CM

## 2020-11-18 DIAGNOSIS — I2694 Multiple subsegmental pulmonary emboli without acute cor pulmonale: Principal | ICD-10-CM

## 2020-11-18 LAB — CBC WITH DIFFERENTIAL/PLATELET
Abs Immature Granulocytes: 0.08 10*3/uL — ABNORMAL HIGH (ref 0.00–0.07)
Basophils Absolute: 0.1 10*3/uL (ref 0.0–0.1)
Basophils Relative: 1 %
Eosinophils Absolute: 0.6 10*3/uL — ABNORMAL HIGH (ref 0.0–0.5)
Eosinophils Relative: 6 %
HCT: 35.8 % — ABNORMAL LOW (ref 39.0–52.0)
Hemoglobin: 12.6 g/dL — ABNORMAL LOW (ref 13.0–17.0)
Immature Granulocytes: 1 %
Lymphocytes Relative: 21 %
Lymphs Abs: 2.1 10*3/uL (ref 0.7–4.0)
MCH: 30.9 pg (ref 26.0–34.0)
MCHC: 35.2 g/dL (ref 30.0–36.0)
MCV: 87.7 fL (ref 80.0–100.0)
Monocytes Absolute: 1.1 10*3/uL — ABNORMAL HIGH (ref 0.1–1.0)
Monocytes Relative: 11 %
Neutro Abs: 6 10*3/uL (ref 1.7–7.7)
Neutrophils Relative %: 60 %
Platelets: 257 10*3/uL (ref 150–400)
RBC: 4.08 MIL/uL — ABNORMAL LOW (ref 4.22–5.81)
RDW: 13 % (ref 11.5–15.5)
WBC: 10 10*3/uL (ref 4.0–10.5)
nRBC: 0 % (ref 0.0–0.2)

## 2020-11-18 LAB — COMPREHENSIVE METABOLIC PANEL
ALT: 51 U/L — ABNORMAL HIGH (ref 0–44)
AST: 32 U/L (ref 15–41)
Albumin: 3.4 g/dL — ABNORMAL LOW (ref 3.5–5.0)
Alkaline Phosphatase: 100 U/L (ref 38–126)
Anion gap: 8 (ref 5–15)
BUN: 19 mg/dL (ref 6–20)
CO2: 25 mmol/L (ref 22–32)
Calcium: 9.2 mg/dL (ref 8.9–10.3)
Chloride: 99 mmol/L (ref 98–111)
Creatinine, Ser: 0.88 mg/dL (ref 0.61–1.24)
GFR, Estimated: >60 mL/min (ref >60)
Glucose, Bld: 98 mg/dL (ref 70–99)
Potassium: 4.1 mmol/L (ref 3.5–5.1)
Sodium: 132 mmol/L — ABNORMAL LOW (ref 135–145)
Total Bilirubin: 0.7 mg/dL (ref 0.3–1.2)
Total Protein: 7.3 g/dL (ref 6.5–8.1)

## 2020-11-18 LAB — MAGNESIUM: Magnesium: 2.2 mg/dL (ref 1.7–2.4)

## 2020-11-18 LAB — FERRITIN: Ferritin: 566 ng/mL — ABNORMAL HIGH (ref 24–336)

## 2020-11-18 LAB — C-REACTIVE PROTEIN: CRP: 22.7 mg/dL — ABNORMAL HIGH (ref <1.0)

## 2020-11-18 LAB — PHOSPHORUS: Phosphorus: 4.5 mg/dL (ref 2.5–4.6)

## 2020-11-18 MED ORDER — ATORVASTATIN CALCIUM 80 MG PO TABS
80.0000 mg | ORAL_TABLET | Freq: Every day | ORAL | 0 refills | Status: AC
  Filled 2020-11-18: qty 30, 30d supply, fill #0

## 2020-11-18 MED ORDER — OXYCODONE HCL 5 MG PO TABS: 5.0000 mg | ORAL_TABLET | Freq: Four times a day (QID) | ORAL | 0 refills | Status: DC | PRN

## 2020-11-18 MED ORDER — FLUTICASONE PROPIONATE HFA 110 MCG/ACT IN AERO
2.0000 | INHALATION_SPRAY | Freq: Two times a day (BID) | RESPIRATORY_TRACT | Status: DC
  Administered 2020-11-18: 2 via RESPIRATORY_TRACT
  Filled 2020-11-18: qty 12

## 2020-11-18 MED ORDER — FOLIC ACID 1 MG PO TABS
1.0000 mg | ORAL_TABLET | Freq: Every day | ORAL | 0 refills | Status: DC
  Filled 2020-11-18: qty 30, 30d supply, fill #0

## 2020-11-18 MED ORDER — APIXABAN 5 MG PO TABS
ORAL_TABLET | ORAL | 0 refills | Status: AC
  Filled 2020-11-18 (×2): qty 66, 30d supply, fill #0

## 2020-11-18 MED ORDER — VITAMIN B-1 100 MG PO TABS
100.0000 mg | ORAL_TABLET | Freq: Every day | ORAL | 0 refills | Status: DC
  Filled 2020-11-18: qty 30, 30d supply, fill #0

## 2020-11-18 MED ORDER — PREDNISONE 20 MG PO TABS
40.0000 mg | ORAL_TABLET | Freq: Every day | ORAL | 0 refills | Status: AC
  Filled 2020-11-18: qty 10, 5d supply, fill #0

## 2020-11-18 MED ORDER — FLUTICASONE PROPIONATE HFA 110 MCG/ACT IN AERO
2.0000 | INHALATION_SPRAY | Freq: Two times a day (BID) | RESPIRATORY_TRACT | 12 refills | Status: DC
Start: 2020-11-18 — End: 2021-01-08

## 2020-11-18 MED ORDER — TICAGRELOR 90 MG PO TABS
90.0000 mg | ORAL_TABLET | Freq: Two times a day (BID) | ORAL | 0 refills | Status: DC
  Filled 2020-11-18: qty 60, 30d supply, fill #0

## 2020-11-18 MED ORDER — METOPROLOL TARTRATE 25 MG PO TABS
25.0000 mg | ORAL_TABLET | Freq: Two times a day (BID) | ORAL | 0 refills | Status: AC
  Filled 2020-11-18: qty 60, 30d supply, fill #0

## 2020-11-18 MED ORDER — LISINOPRIL 5 MG PO TABS
5.0000 mg | ORAL_TABLET | Freq: Every day | ORAL | 0 refills | Status: AC
  Filled 2020-11-18: qty 30, 30d supply, fill #0

## 2020-11-18 MED ORDER — PREDNISONE 20 MG PO TABS
40.0000 mg | ORAL_TABLET | Freq: Every day | ORAL | Status: DC
  Administered 2020-11-18: 40 mg via ORAL
  Filled 2020-11-18: qty 2

## 2020-11-18 NOTE — TOC Transition Note (Signed)
Transition of Care [facility]) - CM/SW Discharge Note   Patient Details  Name: JERIME ARIF MRN: [id number] Date of Birth: December 17, 1985  [facility]) CM/SW Contact:  Margarito Liner, LCSW Phone Number: 11/18/2020, 9:39 AM   Clinical Narrative:   Patient on COVID isolation precautions. CSW called patient in the room, introduced role, and explained that discharge planning would be discussed. Patient confirmed he does not have a PCP. He said his wife is working on Civil engineer, contracting for [employer]. Put PCP packet for free and low-cost healthcare in [facility] and intake paperwork for [facility] in discharge packet. Patient will ask his wife to pick up his prescriptions at [facility] on her way to the hospital. He's unsure what time this will be but said it will be before they close at 4:30. Left voicemail at [facility] to notify them and included wife's phone number if needed. Oxycodone was sent to [facility] on [address] which patient understands he will need to go pick up. No further concerns. CSW signing off.  Final next level of care: Home/Self Care Barriers to Discharge: No Barriers Identified   Patient Goals and CMS Choice        Discharge Placement                Patient to be transferred to facility by: Wife will take him home.   Patient and family notified of of transfer: 11/18/20  Discharge Plan and Services                                     Social Determinants of Health (SDOH) Interventions     Readmission Risk Interventions No flowsheet data found.

## 2020-11-18 NOTE — Progress Notes (Signed)
Patient discharging home. Vital signs stable at time of discharge as reflected in discharge summary. Discharge instructions given and verbal understanding returned. Patient discharged with filled prescriptions that were picked up by wife and inhaler. No questions at this time.

## 2020-11-18 NOTE — Telephone Encounter (Signed)
Received call from [facility] to schedule a follow up appointment for patient from hospital admission. Advised that we have been trying to reach the patient to notify that he needs to fill out an application. Advised patient can come by the office and pick up or he can print from our website.
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
## 2020-11-25 ENCOUNTER — Other Ambulatory Visit: Payer: Self-pay

## 2020-12-14 ENCOUNTER — Other Ambulatory Visit: Payer: Self-pay

## 2020-12-14 ENCOUNTER — Ambulatory Visit: Payer: Self-pay | Admitting: Pharmacy Technician

## 2020-12-14 DIAGNOSIS — Z79899 Other long term (current) drug therapy: Secondary | ICD-10-CM

## 2020-12-15 NOTE — Progress Notes (Signed)
Completed [facility] application and contract.  Patient agreed to all terms of the [facility] contract.    Patient to provide verification of spouse's income, 2021 Federal Tax Return from spouse, recent bank statement from spouse and proof that patient is not covered under spouse's health plan.  Patient acknowledged that he understood no refills on medications can be granted without providing the requested financial documentation.    Provided patient with Community education officer based on his particular needs.    Sherilyn Dacosta Care Manager [facility]

## 2020-12-18 ENCOUNTER — Ambulatory Visit (INDEPENDENT_AMBULATORY_CARE_PROVIDER_SITE_OTHER): Payer: Self-pay | Admitting: Vascular Surgery

## 2020-12-22 ENCOUNTER — Ambulatory Visit (INDEPENDENT_AMBULATORY_CARE_PROVIDER_SITE_OTHER): Payer: Self-pay | Admitting: Nurse Practitioner

## 2020-12-22 ENCOUNTER — Other Ambulatory Visit: Payer: Self-pay

## 2020-12-22 VITALS — BP 113/73 | HR 71 | Ht 70.0 in | Wt 218.0 lb

## 2020-12-22 DIAGNOSIS — E785 Hyperlipidemia, unspecified: Secondary | ICD-10-CM

## 2020-12-22 DIAGNOSIS — I10 Essential (primary) hypertension: Secondary | ICD-10-CM

## 2020-12-22 DIAGNOSIS — I2694 Multiple subsegmental pulmonary emboli without acute cor pulmonale: Secondary | ICD-10-CM

## 2020-12-27 ENCOUNTER — Encounter (INDEPENDENT_AMBULATORY_CARE_PROVIDER_SITE_OTHER): Payer: Self-pay | Admitting: Nurse Practitioner
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
## 2021-01-08 ENCOUNTER — Ambulatory Visit (INDEPENDENT_AMBULATORY_CARE_PROVIDER_SITE_OTHER): Payer: Medicaid Other | Admitting: Neurology

## 2021-01-08 ENCOUNTER — Encounter: Payer: Self-pay | Admitting: Neurology

## 2021-01-08 ENCOUNTER — Other Ambulatory Visit: Payer: Self-pay

## 2021-01-08 VITALS — BP 108/69 | HR 60 | Ht 70.0 in | Wt 223.0 lb

## 2021-01-08 DIAGNOSIS — G5721 Lesion of femoral nerve, right lower limb: Secondary | ICD-10-CM
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
## 2021-01-08 NOTE — Patient Instructions (Signed)
If your numbness gets worse, please come back and see me.

## 2021-01-21 ENCOUNTER — Telehealth: Payer: Self-pay | Admitting: Pharmacy Technician

## 2021-01-21 NOTE — Telephone Encounter (Signed)
Patient failed to provide 2022 proof of income.  No additional medication assistance will be provided by [facility] without the required proof of income documentation.  Patient notified by letter.  Sherilyn Dacosta Care Manager [facility]    [address] [place], [place]  [address]   January 21, 2021    [facility] [address] [place], [place]  [address]  Dear [name]:  This is to inform you that you are no longer eligible to receive medication assistance at [facility].  The reason(s) are:    _____Your total gross monthly household income exceeds 250% of the Federal Poverty Level.   _____Tangible assets (savings, checking, stocks/bonds, pension, retirement, etc.) exceeds our limit  _____You are eligible to receive benefits from [facility], [facility] or HIV Medication              Assistance Program _____You are eligible to receive benefits from a Medicare Part D plan _____You have prescription insurance  _____You are not an [facility] resident __X__Failure to provide all requested documentation.  Still need to provide paystubs and bank statement for spouse, as well as 2021 Federal Tax Return and verification that you are not covered under your spouse's health insurance plan.   Medication assistance will resume once all requested documentation has been returned to our clinic.  If you have questions, please contact our clinic at [phone].    Thank you,  [facility]

## 2021-01-25 ENCOUNTER — Other Ambulatory Visit: Payer: Self-pay
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
## 2021-02-03 ENCOUNTER — Ambulatory Visit: Payer: Self-pay | Admitting: Family

## 2021-02-03 ENCOUNTER — Telehealth: Payer: Self-pay | Admitting: Family

## 2021-02-03 NOTE — Telephone Encounter (Signed)
Patient did not show for his [facility] appointment on 02/03/21. Will attempt to reschedule.

## 2021-02-04 ENCOUNTER — Other Ambulatory Visit: Payer: Self-pay | Admitting: Cardiology

## 2021-12-06 ENCOUNTER — Encounter (INDEPENDENT_AMBULATORY_CARE_PROVIDER_SITE_OTHER): Payer: Self-pay

## 2021-12-21 ENCOUNTER — Ambulatory Visit (INDEPENDENT_AMBULATORY_CARE_PROVIDER_SITE_OTHER): Payer: Medicaid Other | Admitting: Nurse Practitioner

## 2021-12-21 ENCOUNTER — Encounter (INDEPENDENT_AMBULATORY_CARE_PROVIDER_SITE_OTHER): Payer: Self-pay | Admitting: Nurse Practitioner

## 2021-12-21 VITALS — BP 126/82 | HR 61 | Resp 16 | Wt 224.0 lb

## 2021-12-21 DIAGNOSIS — E785 Hyperlipidemia, unspecified: Secondary | ICD-10-CM | POA: Diagnosis not present

## 2021-12-21 DIAGNOSIS — I10 Essential (primary) hypertension: Secondary | ICD-10-CM | POA: Diagnosis not present

## 2021-12-21 DIAGNOSIS — I2694 Multiple subsegmental pulmonary emboli without acute cor pulmonale: Secondary | ICD-10-CM

## 2022-01-03 ENCOUNTER — Encounter (INDEPENDENT_AMBULATORY_CARE_PROVIDER_SITE_OTHER): Payer: Self-pay | Admitting: Nurse Practitioner
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
## 2022-10-05 ENCOUNTER — Other Ambulatory Visit: Payer: Self-pay

## 2022-10-18 ENCOUNTER — Other Ambulatory Visit: Payer: Self-pay

## 2022-12-20 ENCOUNTER — Ambulatory Visit (INDEPENDENT_AMBULATORY_CARE_PROVIDER_SITE_OTHER): Payer: Medicaid Other | Admitting: Vascular Surgery

## 2023-01-13 ENCOUNTER — Other Ambulatory Visit: Payer: Self-pay | Admitting: Physician Assistant

## 2023-01-13 DIAGNOSIS — R911 Solitary pulmonary nodule: Secondary | ICD-10-CM

## 2023-01-17 ENCOUNTER — Ambulatory Visit
Admission: RE | Admit: 2023-01-17 | Discharge: 2023-01-17 | Disposition: A | Discharge status: Home / Self Care | Payer: Medicaid Other | Source: Ambulatory Visit | Attending: Physician Assistant | Admitting: Physician Assistant

## 2023-01-17 ENCOUNTER — Encounter (INDEPENDENT_AMBULATORY_CARE_PROVIDER_SITE_OTHER): Payer: Self-pay | Admitting: Vascular Surgery

## 2023-01-17 ENCOUNTER — Ambulatory Visit (INDEPENDENT_AMBULATORY_CARE_PROVIDER_SITE_OTHER): Payer: Medicaid Other | Admitting: Vascular Surgery

## 2023-01-17 VITALS — BP 131/88 | HR 65 | Resp 16 | Wt 230.4 lb

## 2023-01-17 DIAGNOSIS — I255 Ischemic cardiomyopathy: Secondary | ICD-10-CM

## 2023-01-17 DIAGNOSIS — E785 Hyperlipidemia, unspecified: Secondary | ICD-10-CM

## 2023-01-17 DIAGNOSIS — I10 Essential (primary) hypertension: Secondary | ICD-10-CM | POA: Diagnosis not present

## 2023-01-17 DIAGNOSIS — R911 Solitary pulmonary nodule: Secondary | ICD-10-CM | POA: Insufficient documentation

## 2023-01-17 DIAGNOSIS — I2694 Multiple subsegmental pulmonary emboli without acute cor pulmonale: Secondary | ICD-10-CM

## 2023-01-17 NOTE — Assessment & Plan Note (Signed)
Status post pulmonary thrombectomy 2 years ago.
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
## 2023-01-17 NOTE — Assessment & Plan Note (Signed)
lipid control important in reducing the progression of atherosclerotic disease. Continue statin therapy  

## 2023-01-17 NOTE — Assessment & Plan Note (Signed)
blood pressure control important in reducing the progression of atherosclerotic disease. On appropriate oral medications.  

## 2023-01-17 NOTE — Assessment & Plan Note (Signed)
Follows with cardiology 

## 2023-06-27 ENCOUNTER — Encounter (INDEPENDENT_AMBULATORY_CARE_PROVIDER_SITE_OTHER): Payer: Self-pay

## 2024-01-19 ENCOUNTER — Ambulatory Visit (INDEPENDENT_AMBULATORY_CARE_PROVIDER_SITE_OTHER): Payer: Medicaid Other | Admitting: Vascular Surgery

## 2024-01-19 ENCOUNTER — Encounter (INDEPENDENT_AMBULATORY_CARE_PROVIDER_SITE_OTHER): Payer: Self-pay | Admitting: Vascular Surgery

## 2024-01-19 VITALS — BP 118/83 | HR 76 | Resp 18 | Wt 241.6 lb

## 2024-01-19 DIAGNOSIS — I2699 Other pulmonary embolism without acute cor pulmonale: Secondary | ICD-10-CM

## 2024-01-19 DIAGNOSIS — I10 Essential (primary) hypertension: Secondary | ICD-10-CM | POA: Diagnosis not present

## 2024-01-19 DIAGNOSIS — E785 Hyperlipidemia, unspecified: Secondary | ICD-10-CM | POA: Diagnosis not present
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
## 2025-01-17 ENCOUNTER — Ambulatory Visit (INDEPENDENT_AMBULATORY_CARE_PROVIDER_SITE_OTHER): Admitting: Vascular Surgery
